# Patient Record
Sex: Female | Born: 1965
Health system: Southern US, Community
[De-identification: ages and names within clinical notes are randomized; demographics above are authoritative.]

## PROBLEM LIST (undated history)

## (undated) DIAGNOSIS — Z5189 Encounter for other specified aftercare: Secondary | ICD-10-CM

## (undated) DIAGNOSIS — D649 Anemia, unspecified: Secondary | ICD-10-CM

## (undated) HISTORY — DX: Encounter for other specified aftercare: Z51.89

## (undated) HISTORY — DX: Anemia, unspecified: D64.9

---

## 1992-04-18 DIAGNOSIS — Z5189 Encounter for other specified aftercare: Secondary | ICD-10-CM

## 1992-04-18 HISTORY — DX: Encounter for other specified aftercare: Z51.89

## 2000-06-30 ENCOUNTER — Other Ambulatory Visit: Admission: RE | Admit: 2000-06-30 | Discharge: 2000-06-30 | Payer: Self-pay | Admitting: Family Medicine

## 2001-03-07 ENCOUNTER — Other Ambulatory Visit: Admission: RE | Admit: 2001-03-07 | Discharge: 2001-03-07 | Payer: Self-pay | Admitting: Obstetrics and Gynecology

## 2001-05-14 ENCOUNTER — Other Ambulatory Visit: Admission: RE | Admit: 2001-05-14 | Discharge: 2001-05-14 | Payer: Self-pay | Admitting: Obstetrics and Gynecology

## 2002-03-06 ENCOUNTER — Ambulatory Visit (HOSPITAL_COMMUNITY): Admission: RE | Admit: 2002-03-06 | Discharge: 2002-03-06 | Payer: Self-pay | Admitting: Family Medicine

## 2002-03-06 ENCOUNTER — Encounter: Payer: Self-pay | Admitting: Family Medicine

## 2003-02-26 ENCOUNTER — Ambulatory Visit (HOSPITAL_COMMUNITY): Admission: RE | Admit: 2003-02-26 | Discharge: 2003-02-26 | Payer: Self-pay | Admitting: Family Medicine

## 2003-07-07 ENCOUNTER — Other Ambulatory Visit: Admission: RE | Admit: 2003-07-07 | Discharge: 2003-07-07 | Payer: Self-pay | Admitting: Family Medicine

## 2005-04-26 ENCOUNTER — Ambulatory Visit (HOSPITAL_COMMUNITY): Admission: RE | Admit: 2005-04-26 | Discharge: 2005-04-26 | Payer: Self-pay | Admitting: Family Medicine

## 2005-05-06 ENCOUNTER — Other Ambulatory Visit: Admission: RE | Admit: 2005-05-06 | Discharge: 2005-05-06 | Payer: Self-pay | Admitting: Family Medicine

## 2007-01-04 ENCOUNTER — Emergency Department (HOSPITAL_COMMUNITY): Admission: EM | Admit: 2007-01-04 | Discharge: 2007-01-04 | Payer: Self-pay | Admitting: Emergency Medicine

## 2009-06-01 ENCOUNTER — Emergency Department (HOSPITAL_COMMUNITY): Admission: EM | Admit: 2009-06-01 | Discharge: 2009-06-01 | Payer: Self-pay | Admitting: Family Medicine

## 2010-07-08 LAB — POCT RAPID STREP A (OFFICE): Streptococcus, Group A Screen (Direct): POSITIVE — AB

## 2010-10-07 ENCOUNTER — Inpatient Hospital Stay (INDEPENDENT_AMBULATORY_CARE_PROVIDER_SITE_OTHER)
Admission: RE | Admit: 2010-10-07 | Discharge: 2010-10-07 | Disposition: A | Discharge status: Home / Self Care | Payer: 59 | Source: Ambulatory Visit | Attending: Family Medicine | Admitting: Family Medicine

## 2010-10-07 DIAGNOSIS — IMO0002 Reserved for concepts with insufficient information to code with codable children: Secondary | ICD-10-CM

## 2011-03-31 ENCOUNTER — Other Ambulatory Visit: Payer: Self-pay | Admitting: Family Medicine

## 2011-03-31 ENCOUNTER — Other Ambulatory Visit (HOSPITAL_COMMUNITY)
Admission: RE | Admit: 2011-03-31 | Discharge: 2011-03-31 | Disposition: A | Discharge status: Home / Self Care | Payer: 59 | Source: Ambulatory Visit | Attending: Family Medicine | Admitting: Family Medicine

## 2011-03-31 DIAGNOSIS — Z01419 Encounter for gynecological examination (general) (routine) without abnormal findings: Secondary | ICD-10-CM | POA: Insufficient documentation

## 2012-06-19 ENCOUNTER — Other Ambulatory Visit: Payer: Self-pay | Admitting: Obstetrics and Gynecology

## 2012-06-19 ENCOUNTER — Other Ambulatory Visit (HOSPITAL_COMMUNITY)
Admission: RE | Admit: 2012-06-19 | Discharge: 2012-06-19 | Disposition: A | Discharge status: Home / Self Care | Payer: 59 | Source: Ambulatory Visit | Attending: Obstetrics and Gynecology | Admitting: Obstetrics and Gynecology

## 2012-06-19 DIAGNOSIS — Z1151 Encounter for screening for human papillomavirus (HPV): Secondary | ICD-10-CM | POA: Insufficient documentation

## 2012-06-19 DIAGNOSIS — Z01419 Encounter for gynecological examination (general) (routine) without abnormal findings: Secondary | ICD-10-CM | POA: Insufficient documentation

## 2012-09-15 ENCOUNTER — Emergency Department (INDEPENDENT_AMBULATORY_CARE_PROVIDER_SITE_OTHER)
Admission: EM | Admit: 2012-09-15 | Discharge: 2012-09-15 | Disposition: A | Discharge status: Home / Self Care | Payer: 59 | Source: Home / Self Care

## 2012-09-15 ENCOUNTER — Encounter (HOSPITAL_COMMUNITY): Payer: Self-pay | Admitting: Emergency Medicine

## 2012-09-15 DIAGNOSIS — J0100 Acute maxillary sinusitis, unspecified: Secondary | ICD-10-CM

## 2012-09-15 MED ORDER — FLUTICASONE PROPIONATE 50 MCG/ACT NA SUSP: 2.0000 | Freq: Every day | NASAL | Status: DC

## 2012-09-15 MED ORDER — METHYLPREDNISOLONE 4 MG PO KIT: PACK | ORAL | Status: DC

## 2012-09-15 MED ORDER — AMOXICILLIN-POT CLAVULANATE 875-125 MG PO TABS: 1.0000 | ORAL_TABLET | Freq: Two times a day (BID) | ORAL | Status: DC

## 2012-09-15 NOTE — ED Notes (Signed)
Reports left eye drainage and weird sensation of left jaw radiating up to left ear. Symptoms present since Sunday. Pt has not tried any otc meds for treatment. Denies any other symptoms.

## 2012-09-15 NOTE — ED Provider Notes (Signed)
History     CSN: [id number]  Arrival date & time 09/15/12  1353   First MD Initiated Contact with Patient 09/15/12 1443      Chief Complaint  Patient presents with  . Eye Drainage    left eye drainge since sunday. denies irritation. funny sensation of left jaw line.    (Consider location/radiation/quality/duration/timing/severity/associated sxs/prior treatment) HPI Comments: 47 year old female presents with a one-week history of left eye watering and sensation of puffiness below the left eye. She also has a sensation of swelling in the left face associated with mild discomfort. She denies problems with vision, speech, hearing or swallowing. Denies sore throat or fever. Denies pain around the eye.   History reviewed. No pertinent past medical history.  History reviewed. No pertinent past surgical history.  History reviewed. No pertinent family history.  History  Substance Use Topics  . Smoking status: Never Smoker   . Smokeless tobacco: Not on file  . Alcohol Use: No    OB History   Grav Para Term Preterm Abortions TAB SAB Ect Mult Living                  Review of Systems  Constitutional: Negative.   HENT: Positive for facial swelling. Negative for ear pain, sore throat and rhinorrhea.   Respiratory: Negative.   Gastrointestinal: Negative.   Genitourinary: Negative.   Skin: Negative.   Neurological: Positive for numbness. Negative for tremors, syncope, facial asymmetry, speech difficulty and headaches.       Sensation of mild numbness in the lower left face.    Allergies  Review of patient's allergies indicates not on file.  Home Medications   Current Outpatient Rx  Name  Route  Sig  Dispense  Refill  . amoxicillin-clavulanate (AUGMENTIN) 875-125 MG per tablet   Oral   Take 1 tablet by mouth every 12 (twelve) hours.   14 tablet   0   . fluticasone (FLONASE) 50 MCG/ACT nasal spray   Nasal   Place 2 sprays into the nose daily.   16 g   2   .  methylPREDNISolone (MEDROL DOSEPAK) 4 MG tablet      follow package directions   21 tablet   0     BP 108/87  Pulse 60  Temp(Src) 97.9 F (36.6 C) (Oral)  SpO2 100%  Physical Exam  Nursing note and vitals reviewed. Constitutional: She is oriented to person, place, and time. She appears well-nourished. No distress.  HENT:  Right Ear: External ear normal.  Left Ear: External ear normal.  Mouth/Throat: Oropharynx is clear and moist. No oropharyngeal exudate.  No dental tenderness. No gingival erythema or evidence of abscess. No erythema or swelling to the buccal mucosa. When pressure is applied to the left maxillary sinus there is moderate tenderness.  Eyes: Conjunctivae and EOM are normal. Pupils are equal, round, and reactive to light. Right eye exhibits no discharge. Left eye exhibits no discharge.  Neck: Normal range of motion.  Cardiovascular: Normal rate, regular rhythm and normal heart sounds.   Pulmonary/Chest: Effort normal and breath sounds normal. No respiratory distress. She has no wheezes.  Lymphadenopathy:    She has no cervical adenopathy.  Neurological: She is alert and oriented to person, place, and time.  Skin: Skin is warm and dry.  Psychiatric: She has a normal mood and affect.    ED Course  Procedures (including critical care time)  Labs Reviewed - No data to display No results found.   1.  Sinusitis, acute, maxillary       MDM  Medrol Dosepak as directed Fluticasone nasal spray as directed Augmentin 875 twice a day for 7 days Warm compresses to the face Sudafed PE 10 mg every 4-6 hours when necessary congestion Total of your primary care doctor next week as needed.         Hayden Rasmussen, NP 09/15/12 1526

## 2012-09-15 NOTE — ED Notes (Signed)
Pt states that she had noticed some swelling/puffiness of left eye also.

## 2012-09-16 NOTE — ED Provider Notes (Signed)
Medical screening examination/treatment/procedure(s) were performed by resident physician or non-physician practitioner and as supervising physician I was immediately available for consultation/collaboration.   Coriana Angello DOUGLAS MD.   Rael Yo D Alixander Rallis, MD 09/16/12 1841 

## 2014-07-24 ENCOUNTER — Encounter: Payer: Self-pay | Admitting: Internal Medicine

## 2014-09-26 ENCOUNTER — Telehealth: Payer: Self-pay | Admitting: *Deleted

## 2014-09-26 NOTE — Telephone Encounter (Signed)
Patient no show for colonoscopy appointment 09/26/14 at 230 pm. Attempted to call cell phone number, disconnected. Called work number and was informed patient out of the office until Tuesday. No messages left. No show letter mailed to patient and will give chart to Y. Homero Fellers to try and reschedule with patient.

## 2014-09-30 ENCOUNTER — Encounter: Payer: Self-pay | Admitting: Internal Medicine

## 2014-10-10 ENCOUNTER — Encounter: Payer: 59 | Admitting: Internal Medicine

## 2014-10-15 ENCOUNTER — Encounter: Payer: 59 | Admitting: Internal Medicine

## 2014-11-26 ENCOUNTER — Encounter: Payer: 59 | Admitting: Internal Medicine

## 2015-01-23 ENCOUNTER — Ambulatory Visit (AMBULATORY_SURGERY_CENTER): Payer: Self-pay | Admitting: *Deleted

## 2015-01-23 VITALS — Ht 66.0 in | Wt 228.0 lb

## 2015-01-23 DIAGNOSIS — Z1211 Encounter for screening for malignant neoplasm of colon: Secondary | ICD-10-CM

## 2015-01-23 NOTE — Progress Notes (Signed)
No egg or soy allergy No home 02 use No diet pills No issues with past sedation. Did have epidural headache after 1990 c section that had to be treated  emmi video to e mail

## 2015-02-13 ENCOUNTER — Encounter: Payer: 59 | Admitting: Gastroenterology

## 2015-03-17 ENCOUNTER — Encounter: Payer: Self-pay | Admitting: Gastroenterology

## 2015-03-17 ENCOUNTER — Ambulatory Visit (AMBULATORY_SURGERY_CENTER): Payer: 59 | Admitting: Gastroenterology

## 2015-03-17 VITALS — BP 104/51 | HR 71 | Temp 97.3°F | Resp 16 | Ht 66.0 in | Wt 228.0 lb

## 2015-03-17 DIAGNOSIS — Z1211 Encounter for screening for malignant neoplasm of colon: Secondary | ICD-10-CM | POA: Diagnosis not present

## 2015-03-17 MED ORDER — SODIUM CHLORIDE 0.9 % IV SOLN: 500.0000 mL | INTRAVENOUS | Status: DC

## 2015-03-17 NOTE — Progress Notes (Signed)
Stable to RR 

## 2015-03-17 NOTE — Patient Instructions (Signed)
YOU HAD AN ENDOSCOPIC PROCEDURE TODAY AT THE [facility]:   Refer to the procedure report that was given to you for any specific questions about what was found during the examination.  If the procedure report does not answer your questions, please call your gastroenterologist to clarify.  If you requested that your care partner not be given the details of your procedure findings, then the procedure report has been included in a sealed envelope for you to review at your convenience later.  YOU SHOULD EXPECT: Some feelings of bloating in the abdomen. Passage of more gas than usual.  Walking can help get rid of the air that was put into your GI tract during the procedure and reduce the bloating. If you had a lower endoscopy (such as a colonoscopy or flexible sigmoidoscopy) you may notice spotting of blood in your stool or on the toilet paper. If you underwent a bowel prep for your procedure, you may not have a normal bowel movement for a few days.  Please Note:  You might notice some irritation and congestion in your nose or some drainage.  This is from the oxygen used during your procedure.  There is no need for concern and it should clear up in a day or so.  SYMPTOMS TO REPORT IMMEDIATELY:   Following lower endoscopy (colonoscopy or flexible sigmoidoscopy):  Excessive amounts of blood in the stool  Significant tenderness or worsening of abdominal pains  Swelling of the abdomen that is new, acute  Fever of 100F or higher   For urgent or emergent issues, a gastroenterologist can be reached at any hour by calling [phone].   DIET: Your first meal following the procedure should be a small meal and then it is ok to progress to your normal diet. Heavy or fried foods are harder to digest and may make you feel nauseous or bloated.  Likewise, meals heavy in dairy and vegetables can increase bloating.  Drink plenty of fluids but you should avoid alcoholic beverages for 24  hours.  ACTIVITY:  You should plan to take it easy for the rest of today and you should NOT DRIVE or use heavy machinery until tomorrow (because of the sedation medicines used during the test).    FOLLOW UP: Our staff will call the number listed on your records the next business day following your procedure to check on you and address any questions or concerns that you may have regarding the information given to you following your procedure. If we do not reach you, we will leave a message.  However, if you are feeling well and you are not experiencing any problems, there is no need to return our call.  We will assume that you have returned to your regular daily activities without incident.  If any biopsies were taken you will be contacted by phone or by letter within the next 1-3 weeks.  Please call us at [phone] if you have not heard about the biopsies in 3 weeks.    SIGNATURES/CONFIDENTIALITY: You and/or your care partner have signed paperwork which will be entered into your electronic medical record.  These signatures attest to the fact that that the information above on your After Visit Summary has been reviewed and is understood.  Full responsibility of the confidentiality of this discharge information lies with you and/or your care-partner. 

## 2015-03-17 NOTE — Op Note (Signed)
[facility] [address] [place], [id number]   COLONOSCOPY PROCEDURE REPORT  PATIENT: Jenny Carroll, Jenny B  MR#: [id number] BIRTHDATE: 1965/12/04 , 49  yrs. old GENDER: female ENDOSCOPIST: Marsa ArisKavitha Nandigam, MD REFERRED ON:[id number]:Cynthia Cliffton AstersWhite, M.D. PROCEDURE DATE:  03/17/2015 PROCEDURE:   Colonoscopy, screening First Screening Colonoscopy - Avg.  risk and is 50 yrs.  old or older Yes.  Prior Negative Screening - Now for repeat screening. N/A  History of Adenoma - Now for follow-up colonoscopy & has been > or = to 3 yrs.  N/A  Polyps removed today? No Recommend repeat exam, <10 yrs? No ASA CLASS:   Class I INDICATIONS:Screening for colonic neoplasia and Colorectal Neoplasm Risk Assessment for this procedure is average risk. MEDICATIONS: Propofol 250 mg IV and Lidocaine 40 mg  DESCRIPTION OF PROCEDURE:   After the risks benefits and alternatives of the procedure were thoroughly explained, informed consent was obtained.  The digital rectal exam revealed no abnormalities of the rectum.   The LB PFC-H190 [id number]  endoscope was introduced through the anus and advanced to the cecum, which was identified by both the appendix and ileocecal valve. No adverse events experienced.   The quality of the prep was good.  The instrument was then slowly withdrawn as the colon was fully examined. Estimated blood loss is zero unless otherwise noted in this procedure report.   COLON FINDINGS: A normal appearing cecum, ileocecal valve, and appendiceal orifice were identified.  The ascending, transverse, descending, sigmoid colon, and rectum appeared unremarkable. Retroflexed views revealed internal Grade I hemorrhoids. The time to cecum = 5.18 Withdrawal time = 7.50        The scope was withdrawn and the procedure completed. COMPLICATIONS: There were no immediate complications.  ENDOSCOPIC IMPRESSION: Normal colonoscopy  RECOMMENDATIONS: 1.  You should continue to follow colorectal cancer  screening guidelines for "routine risk" patients with a repeat colonoscopy in 10 years.  eSigned:  Marsa ArisKavitha Nandigam, MD 03/17/2015 11:37 AM

## 2015-03-18 ENCOUNTER — Telehealth: Payer: Self-pay | Admitting: *Deleted

## 2015-03-18 NOTE — Telephone Encounter (Signed)
  Follow up Call-  Call back number 03/17/2015  Post procedure Call Back phone  # [phone]  Permission to leave phone message Yes     Patient questions:  Do you have a fever, pain , or abdominal swelling? No. Pain Score  0 *  Have you tolerated food without any problems? Yes.    Have you been able to return to your normal activities? Yes.    Do you have any questions about your discharge instructions: Diet   No. Medications  No. Follow up visit  No.  Do you have questions or concerns about your Care? No.  Actions: * If pain score is 4 or above: No action needed, pain <4.

## 2015-04-14 ENCOUNTER — Ambulatory Visit (INDEPENDENT_AMBULATORY_CARE_PROVIDER_SITE_OTHER): Payer: 59

## 2015-04-14 ENCOUNTER — Ambulatory Visit (INDEPENDENT_AMBULATORY_CARE_PROVIDER_SITE_OTHER): Payer: 59 | Admitting: Family Medicine

## 2015-04-14 VITALS — BP 138/86 | HR 74 | Temp 97.9°F | Resp 16 | Ht 66.25 in | Wt 228.6 lb

## 2015-04-14 DIAGNOSIS — M792 Neuralgia and neuritis, unspecified: Secondary | ICD-10-CM | POA: Diagnosis not present
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
## 2015-04-14 NOTE — Patient Instructions (Signed)
It appears that you have a "stinger" or "burner"- this is when the brachial plexus nerves are stretched from an injury I expect that your symptoms will go away totally- however if you still have any discomfort in a week or so let me know You may certainly use ibuprofen as needed over the next few days- you can take up to 800 mg three times a day if needed  Contact me right away if any worsening of your symptoms.

## 2015-04-22 ENCOUNTER — Ambulatory Visit: Payer: 59

## 2015-05-04 ENCOUNTER — Ambulatory Visit (INDEPENDENT_AMBULATORY_CARE_PROVIDER_SITE_OTHER): Payer: 59 | Admitting: Family Medicine

## 2015-05-04 VITALS — BP 116/60 | HR 88 | Temp 98.3°F | Resp 16 | Ht 66.5 in | Wt 230.0 lb

## 2015-05-04 DIAGNOSIS — M792 Neuralgia and neuritis, unspecified: Secondary | ICD-10-CM | POA: Diagnosis not present

## 2015-05-04 DIAGNOSIS — M79602 Pain in left arm: Secondary | ICD-10-CM

## 2015-05-04 DIAGNOSIS — T148XXA Other injury of unspecified body region, initial encounter: Secondary | ICD-10-CM
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
## 2015-05-04 NOTE — Patient Instructions (Signed)
The left arm appears to be completely healed. There is no swelling, have good range of motion, there is no black and blue markings in the bones themselves have normal features. I think it's find to go ahead and settle with the insurance company at this point.

## 2015-05-18 MED FILL — IBUPROFEN 800 MG TABLET: 800 | 5 days supply | Qty: 20 | Fill #0

## 2015-05-18 MED FILL — AMOXICILLIN 500 MG CAPSULE: 500 | 7 days supply | Qty: 21 | Fill #0

## 2015-05-19 MED FILL — HYDROCODON-APAP 5-325: 5-325 | 5 days supply | Qty: 16 | Fill #0

## 2015-06-29 DIAGNOSIS — J101 Influenza due to other identified influenza virus with other respiratory manifestations: Secondary | ICD-10-CM | POA: Diagnosis not present

## 2015-06-29 DIAGNOSIS — R6889 Other general symptoms and signs: Secondary | ICD-10-CM | POA: Diagnosis not present

## 2015-09-28 DIAGNOSIS — E6609 Other obesity due to excess calories: Secondary | ICD-10-CM | POA: Diagnosis not present

## 2015-09-28 DIAGNOSIS — D509 Iron deficiency anemia, unspecified: Secondary | ICD-10-CM | POA: Diagnosis not present

## 2015-09-28 DIAGNOSIS — E559 Vitamin D deficiency, unspecified: Secondary | ICD-10-CM | POA: Diagnosis not present

## 2015-09-28 DIAGNOSIS — M25561 Pain in right knee: Secondary | ICD-10-CM | POA: Diagnosis not present

## 2015-09-28 DIAGNOSIS — Z13220 Encounter for screening for lipoid disorders: Secondary | ICD-10-CM | POA: Diagnosis not present

## 2015-09-28 DIAGNOSIS — Z0000 Encounter for general adult medical examination without abnormal findings: Secondary | ICD-10-CM | POA: Diagnosis not present

## 2015-10-05 DIAGNOSIS — Z0000 Encounter for general adult medical examination without abnormal findings: Secondary | ICD-10-CM | POA: Diagnosis not present

## 2015-10-05 DIAGNOSIS — Z13220 Encounter for screening for lipoid disorders: Secondary | ICD-10-CM | POA: Diagnosis not present

## 2015-10-05 DIAGNOSIS — E559 Vitamin D deficiency, unspecified: Secondary | ICD-10-CM | POA: Diagnosis not present

## 2015-10-05 DIAGNOSIS — D509 Iron deficiency anemia, unspecified: Secondary | ICD-10-CM | POA: Diagnosis not present

## 2015-11-17 ENCOUNTER — Encounter: Payer: Self-pay | Admitting: Skilled Nursing Facility1

## 2015-11-17 ENCOUNTER — Encounter: Payer: 59 | Attending: Family Medicine | Admitting: Skilled Nursing Facility1

## 2015-11-17 DIAGNOSIS — Z713 Dietary counseling and surveillance: Secondary | ICD-10-CM | POA: Insufficient documentation

## 2015-11-17 DIAGNOSIS — E669 Obesity, unspecified: Secondary | ICD-10-CM

## 2015-11-17 NOTE — Progress Notes (Signed)
  Medical Nutrition Therapy:  Appt start time: 1500 end time:  1600.   Assessment:  Primary concerns today: self referral. Pt states she is in the appointment due to the live life well plan at [place].  Pt states she has never tried to lose weight before. Pt states she goes to sleep about 11:00pm and wakes 6am. Pt states she is a picky eater. Pt states she is currently at her usual wt. Pt states she has been eating out more often. Pt states it is difficult to cook only for herself.    Preferred Learning Style:   No preference indicated   Learning Readiness:   Contemplating  MEDICATIONS: See List   DIETARY INTAKE:  Usual eating pattern includes 3 meals and 1 snacks per day.  Everyday foods include fast food.  Avoided foods include none identified.    24-hr recall:  B ( AM): none----sometimes cereal mostly nothing Snk ( AM):  fruit L ( PM): none Snk ( PM): crackers D ( PM): spagetti-----baked chicken and rice corn---salad---fried chicken----greens cabbage Snk ( PM): cookies or chips Beverages: sweet tea, soda, water  *2 meals a week outside the home  Usual physical activity: ADL's  Progress Towards Goal(s):  In progress.     Intervention:  Nutrition counseling for obesity. Dietitian educated the pt on balanced meals, fast food, and the impotence of physical activity.  Goals: -Honor your body by listening to your hunger and fullness cues -Possible breakfast: fruit, 2 pieces of bacon, slice of whole wheat OR cereal with 2% milk with chopped banana and a small handful of nuts -Possible lunches: cabbage, baked italian chicken, rice OR grilled chicken, potatoes, and salad OR Peanut butter jelly sandwich and cherry tomatoes -Possible dinners: Leftovers from lunch OR Baked flounder with potato and asparagus  -A meal: carbohydrate, vegetables, and protein -Try to avoid fried foods -A snack: Fruit OR Vegetable AND protein -Try to walk 3 days a week for 20 minutes: when it rains  do a youtube video -Try Zumba again -Eat at least 3 meals a day  Teaching Method Utilized:  Visual Auditory Hands on  Demonstrated degree of understanding via:  Teach Back   Monitoring/Evaluation:  Dietary intake, exercise, and body weight prn.

## 2015-11-17 NOTE — Patient Instructions (Addendum)
-  Honor your body by listening to your hunger and fullness cues -Possible breakfast: fruit, 2 pieces of bacon, slice of whole wheat OR cereal with 2% milk with chopped banana and a small handful of nuts -Possible lunches: cabbage, baked italian chicken, rice OR grilled chicken, potatoes, and salad OR Peanut butter jelly sandwich and cherry tomatoes -Possible dinners: Leftovers from lunch OR Baked flounder with potato and asparagus  -A meal: carbohydrate, vegetables, and protein -Try to avoid fried foods -A snack: Fruit OR Vegetable AND protein -Try to walk 3 days a week for 20 minutes: when it rains do a youtube video -Try Zumba again -Eat at least 3 meals a day

## 2016-04-05 DIAGNOSIS — Z1231 Encounter for screening mammogram for malignant neoplasm of breast: Secondary | ICD-10-CM | POA: Diagnosis not present

## 2016-08-22 DIAGNOSIS — H52222 Regular astigmatism, left eye: Secondary | ICD-10-CM | POA: Diagnosis not present

## 2016-08-22 DIAGNOSIS — H524 Presbyopia: Secondary | ICD-10-CM | POA: Diagnosis not present

## 2016-08-22 DIAGNOSIS — H5203 Hypermetropia, bilateral: Secondary | ICD-10-CM | POA: Diagnosis not present

## 2016-08-22 DIAGNOSIS — H18413 Arcus senilis, bilateral: Secondary | ICD-10-CM | POA: Diagnosis not present

## 2016-11-21 ENCOUNTER — Other Ambulatory Visit (HOSPITAL_COMMUNITY)
Admission: RE | Admit: 2016-11-21 | Discharge: 2016-11-21 | Disposition: A | Discharge status: Home / Self Care | Payer: 59 | Source: Ambulatory Visit | Attending: Family Medicine | Admitting: Family Medicine

## 2016-11-21 ENCOUNTER — Other Ambulatory Visit: Payer: Self-pay | Admitting: Family Medicine

## 2016-11-21 DIAGNOSIS — E559 Vitamin D deficiency, unspecified: Secondary | ICD-10-CM | POA: Diagnosis not present

## 2016-11-21 DIAGNOSIS — B372 Candidiasis of skin and nail: Secondary | ICD-10-CM | POA: Diagnosis not present

## 2016-11-21 DIAGNOSIS — Z124 Encounter for screening for malignant neoplasm of cervix: Secondary | ICD-10-CM | POA: Diagnosis not present

## 2016-11-21 DIAGNOSIS — D509 Iron deficiency anemia, unspecified: Secondary | ICD-10-CM | POA: Diagnosis not present

## 2016-11-21 DIAGNOSIS — N6489 Other specified disorders of breast: Secondary | ICD-10-CM | POA: Diagnosis not present

## 2016-11-21 DIAGNOSIS — Z6837 Body mass index (BMI) 37.0-37.9, adult: Secondary | ICD-10-CM | POA: Diagnosis not present

## 2016-11-21 DIAGNOSIS — Z0000 Encounter for general adult medical examination without abnormal findings: Secondary | ICD-10-CM | POA: Diagnosis not present

## 2016-11-21 DIAGNOSIS — E049 Nontoxic goiter, unspecified: Secondary | ICD-10-CM | POA: Diagnosis not present

## 2016-11-21 DIAGNOSIS — Z13220 Encounter for screening for lipoid disorders: Secondary | ICD-10-CM | POA: Diagnosis not present

## 2016-11-22 LAB — CYTOLOGY - PAP
ADEQUACY: ABSENT
DIAGNOSIS: NEGATIVE
HPV: NOT DETECTED

## 2016-11-29 ENCOUNTER — Other Ambulatory Visit: Payer: Self-pay | Admitting: Family Medicine

## 2016-11-29 DIAGNOSIS — E049 Nontoxic goiter, unspecified: Secondary | ICD-10-CM

## 2016-12-13 ENCOUNTER — Ambulatory Visit
Admission: RE | Admit: 2016-12-13 | Discharge: 2016-12-13 | Disposition: A | Discharge status: Home / Self Care | Payer: 59 | Source: Ambulatory Visit | Attending: Family Medicine | Admitting: Family Medicine

## 2016-12-13 DIAGNOSIS — E049 Nontoxic goiter, unspecified: Secondary | ICD-10-CM

## 2016-12-13 DIAGNOSIS — E041 Nontoxic single thyroid nodule: Secondary | ICD-10-CM | POA: Diagnosis not present

## 2017-01-12 ENCOUNTER — Encounter: Payer: Self-pay | Admitting: Dietician

## 2017-01-12 ENCOUNTER — Encounter: Payer: 59 | Attending: Family Medicine | Admitting: Dietician

## 2017-01-12 DIAGNOSIS — Z713 Dietary counseling and surveillance: Secondary | ICD-10-CM | POA: Insufficient documentation

## 2017-01-12 NOTE — Patient Instructions (Addendum)
Great job on adding breakfast! Aim for an active lifestyle. Listen to your body.  When are you full?  When is it time for bed? Consider rethinking your beverages.  Consider the Calorie [employer].

## 2017-01-12 NOTE — Progress Notes (Signed)
  Medical Nutrition Therapy:  Appt start time: 1445 end time:  1500.   Assessment:  Primary concerns today: Patient is here today alone.  She is here due to the live life well program at [facility].  Weight 234 lbs and overall stable.  She has not health concerns.  She reports losing a little weight in the past but then gaining it back.  She sleeps only about 6 hours per night and states that she stays up even when she is tired.     Patient lives with her daughter currently and they share shopping and cooking.  Her daughter has a Systems analyst and tries to eat healthfully.  She will be living alone in about a month.  She is currently eating out more often.  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  Preferred Learning Style:   No preference indicated   Learning Readiness:   Contemplating   MEDICATIONS: vitamin D 1000 units   DIETARY INTAKE:  Usual eating pattern includes 2-3 meals and 1-2 snacks per day.  24-hr recall:  B ( AM): 2 eggs, fruit  Snk ( AM): occasional NABS  L ( PM): salad with protein OR cucumbers and fruit Snk ( PM): none D ( PM): baked chicken, cabbage, rice Snk ( PM): occasional chips or ice cream  Beverages: regular soda (1), water  Usual physical activity: Enjoys Zumba and walking but does not do this consistently.  Progress Towards Goal(s):  In progress.   Nutritional Diagnosis:  NB-1.1 Food and nutrition-related knowledge deficit As related to balanced eating.  As evidenced by diet hx and patient report.    Intervention:  Nutrition counseling/education related to balanced meals, eating out, and the importance of adequate sleep and physical activity.  Great job on adding breakfast! Aim for an active lifestyle. Listen to your body.  When are you full?  When is it time for bed? Consider rethinking your beverages.  Consider the Calorie [employer].  Teaching Method Utilized:  Auditory  Barriers to learning/adherence to lifestyle change:  motivation  Demonstrated degree of understanding via:  Teach Back   Monitoring/Evaluation:  Dietary intake, exercise, and body weight prn.

## 2017-02-02 DIAGNOSIS — N62 Hypertrophy of breast: Secondary | ICD-10-CM | POA: Diagnosis not present

## 2017-04-05 DIAGNOSIS — Z1231 Encounter for screening mammogram for malignant neoplasm of breast: Secondary | ICD-10-CM | POA: Diagnosis not present

## 2017-07-26 ENCOUNTER — Encounter: Payer: Self-pay | Admitting: Nurse Practitioner

## 2017-07-26 ENCOUNTER — Ambulatory Visit (INDEPENDENT_AMBULATORY_CARE_PROVIDER_SITE_OTHER): Payer: Self-pay | Admitting: Nurse Practitioner

## 2017-07-26 VITALS — BP 120/84 | HR 97 | Temp 98.9°F | Wt 236.6 lb

## 2017-07-26 DIAGNOSIS — J101 Influenza due to other identified influenza virus with other respiratory manifestations: Secondary | ICD-10-CM

## 2017-07-26 DIAGNOSIS — R6889 Other general symptoms and signs: Secondary | ICD-10-CM

## 2017-07-26 LAB — POCT INFLUENZA A/B
Influenza A, POC: POSITIVE — AB
Influenza B, POC: NEGATIVE

## 2017-07-26 MED ORDER — BENZONATATE 100 MG PO CAPS: 100.0000 mg | ORAL_CAPSULE | Freq: Three times a day (TID) | ORAL | 0 refills | Status: AC | PRN

## 2017-07-26 MED ORDER — OSELTAMIVIR PHOSPHATE 75 MG PO CAPS: 75.0000 mg | ORAL_CAPSULE | Freq: Two times a day (BID) | ORAL | 0 refills | Status: AC

## 2017-07-26 MED ORDER — ALBUTEROL SULFATE HFA 108 (90 BASE) MCG/ACT IN AERS: 2.0000 | INHALATION_SPRAY | Freq: Four times a day (QID) | RESPIRATORY_TRACT | 0 refills | Status: DC | PRN

## 2017-07-26 MED FILL — VENTOLIN HFA 90 MCG INHALER: 108 (90 BAS | 25 days supply | Qty: 18 | Fill #0

## 2017-07-26 MED FILL — BENZONATATE 100 MG CAPS: 100 | 10 days supply | Qty: 30 | Fill #0

## 2017-07-26 MED FILL — OSELTAMIVIR PHOSPHATE 75 MG: 75 | 5 days supply | Qty: 10 | Fill #0

## 2017-07-26 NOTE — Progress Notes (Addendum)
   Subjective:    Patient ID: Jenny Carroll, female    DOB: 03/31/1966, 52 y.o.   MRN: [id number]  The patient is a 52 year old female who presents today with complaints of chills and cough times 1 day.  The patient states she never checked her fever but did have moments where she felt hot and cold.  Patient states cough worsened over last night for which she was taking cough medicine with no improvement.  Patient has not taken any medications to control her fever.  Patient does complain of headache, diarrhea, weakness, and body aches.  Patient denies nausea vomiting or abdominal pain.  Patient's past medical history, allergies, and medications were reviewed.  Review of Systems  Constitutional: Positive for activity change, appetite change, chills, fatigue and fever.  HENT: Positive for congestion, rhinorrhea and sinus pressure. Negative for ear discharge, ear pain, postnasal drip, sneezing and sore throat.   Eyes: Negative.   Respiratory: Positive for cough and wheezing.   Cardiovascular: Negative.   Gastrointestinal: Positive for diarrhea. Negative for abdominal pain, constipation, nausea and vomiting.  Allergic/Immunologic: Negative for environmental allergies.  Neurological: Positive for headaches. Negative for tremors, seizures, syncope, weakness and light-headedness.  Psychiatric/Behavioral: Negative.        Objective:   Physical Exam  Constitutional: She is oriented to person, place, and time. She appears well-developed and well-nourished.  Ill-appearing.  HENT:  Head: Normocephalic and atraumatic.  Right Ear: External ear normal.  Left Ear: External ear normal.  Mouth/Throat: Oropharynx is clear and moist. No oropharyngeal exudate.  Eyes: Pupils are equal, round, and reactive to light. Conjunctivae and EOM are normal.  Neck: Normal range of motion. Neck supple. No tracheal deviation present. No thyromegaly present.  Cardiovascular: Normal rate, regular rhythm and normal heart  sounds.  Pulmonary/Chest: Effort normal and breath sounds normal.  Rhonchi to left posterior upper lobe.  Abdominal: Soft. Bowel sounds are normal. She exhibits no distension. There is no tenderness. There is no rebound.  Neurological: She is alert and oriented to person, place, and time.  Skin: Skin is warm and dry.  Psychiatric: She has a normal mood and affect.  Vitals reviewed.      Assessment & Plan:  Influenza A 1 Tamiflu 75 mg twice daily for 5 days. 2. Ibuprofen or Tylenol for pain fever and general discomfort. 3.  Rest.  Fluids. 4.Albuterol for wheezing or shortness of breath and Tessalon Perles for cough as needed. 5.  Discussed with patient that she is contagious while she is febrile.  Patient instructed to remain out of work until she has been fever free for 24 hours. 6.  Patient verbalizes understanding and has no questions at time of discharge.

## 2017-07-26 NOTE — Patient Instructions (Signed)

## 2017-07-26 NOTE — Progress Notes (Signed)
error 

## 2017-08-09 ENCOUNTER — Telehealth: Payer: Self-pay

## 2017-08-29 ENCOUNTER — Encounter: Payer: Self-pay | Admitting: Family

## 2017-08-29 ENCOUNTER — Ambulatory Visit (INDEPENDENT_AMBULATORY_CARE_PROVIDER_SITE_OTHER): Payer: Self-pay | Admitting: Family

## 2017-08-29 VITALS — BP 120/78 | HR 75 | Temp 98.4°F | Resp 20 | Wt 233.0 lb

## 2017-08-29 DIAGNOSIS — Z00129 Encounter for routine child health examination without abnormal findings: Secondary | ICD-10-CM

## 2017-08-29 DIAGNOSIS — Z0000 Encounter for general adult medical examination without abnormal findings: Secondary | ICD-10-CM

## 2017-08-29 LAB — POCT URINALYSIS DIPSTICK
Bilirubin, UA: NEGATIVE
Blood, UA: NEGATIVE
Glucose, UA: NEGATIVE
KETONES UA: NEGATIVE
Leukocytes, UA: NEGATIVE
NITRITE UA: NEGATIVE
Protein, UA: NEGATIVE
SPEC GRAV UA: 1.015 (ref 1.010–1.025)
UROBILINOGEN UA: 0.2 U/dL
pH, UA: 6 (ref 5.0–8.0)

## 2017-08-29 NOTE — Progress Notes (Signed)
Subjective:     Patient ID: Jenny Carroll, female   DOB: 07/09/1965, 52 y.o.   MRN: [id number]  HPI 52 year old AAF, nonsmoker is in today for a CPX, denies any concerns. Reports she started exercising 1 week ago, walking during her lunch break for about 20 mins. She wishes to reduce her weight and plans to incorporate more exercise. She reports having a mammogram Nov 2018, Pap smear is UTD, Colonoscopy was normal in 2016 and does not have a family history of colon CA. She will have fasting labs done through her PCP  Review of Systems  Constitutional: Negative.   HENT: Negative.   Eyes: Negative.   Cardiovascular: Negative.   Gastrointestinal: Negative.   Endocrine: Negative.   Genitourinary: Negative.   Musculoskeletal: Negative.   Skin: Negative.   Allergic/Immunologic: Negative.   Hematological: Negative.   Psychiatric/Behavioral: Negative.    Past Medical History:  Diagnosis Date  . Anemia    history of anemai, better since no periods anymore  . Blood transfusion without reported diagnosis 1994    Social History   Socioeconomic History  . Marital status: Married    Spouse name: Not on file  . Number of children: Not on file  . Years of education: Not on file  . Highest education level: Not on file  Occupational History  . Not on file  Social Needs  . Financial resource strain: Not on file  . Food insecurity:    Worry: Not on file    Inability: Not on file  . Transportation needs:    Medical: Not on file    Non-medical: Not on file  Tobacco Use  . Smoking status: Never Smoker  . Smokeless tobacco: Never Used  Substance and Sexual Activity  . Alcohol use: No    Alcohol/week: 0.0 oz  . Drug use: No  . Sexual activity: Yes  Lifestyle  . Physical activity:    Days per week: Not on file    Minutes per session: Not on file  . Stress: Not on file  Relationships  . Social connections:    Talks on phone: Not on file    Gets together: Not on file    Attends  religious service: Not on file    Active member of club or organization: Not on file    Attends meetings of clubs or organizations: Not on file    Relationship status: Not on file  . Intimate partner violence:    Fear of current or ex partner: Not on file    Emotionally abused: Not on file    Physically abused: Not on file    Forced sexual activity: Not on file  Other Topics Concern  . Not on file  Social History Narrative  . Not on file    Past Surgical History:  Procedure Laterality Date  . CESAREAN SECTION     x3    Family History  Problem Relation Age of Onset  . Heart disease Maternal Uncle   . Diabetes Maternal Grandmother   . Colon cancer Neg Hx   . Rectal cancer Neg Hx   . Stomach cancer Neg Hx   . Esophageal cancer Neg Hx     No Known Allergies  Current Outpatient Medications on File Prior to Visit  Medication Sig Dispense Refill  . albuterol (PROVENTIL HFA) 108 (90 Base) MCG/ACT inhaler Inhale 2 puffs into the lungs every 6 (six) hours as needed for up to 10 days for wheezing or  shortness of breath. 1 Inhaler 0  . cholecalciferol (VITAMIN D) 1000 UNITS tablet Take 1,000 Units by mouth daily.     No current facility-administered medications on file prior to visit.     BP 120/78 (BP Location: Right Arm, Patient Position: Sitting, Cuff Size: Normal)   Pulse 75   Temp 98.4 F (36.9 C) (Oral)   Resp 20   Wt 233 lb (105.7 kg)   SpO2 96%   BMI 37.61 kg/m chart    Objective:   Physical Exam  Constitutional: She is oriented to person, place, and time. She appears well-developed.  HENT:  Head: Normocephalic and atraumatic.  Eyes: Pupils are equal, round, and reactive to light. Conjunctivae and EOM are normal.  Neck: Normal range of motion. Neck supple. No thyromegaly present.  Cardiovascular: Normal rate, regular rhythm and intact distal pulses.  No murmur heard. Pulmonary/Chest: Effort normal and breath sounds normal. No respiratory distress.  Abdominal:  Soft. Bowel sounds are normal. She exhibits no distension. There is no tenderness.  Genitourinary:  Genitourinary Comments: Deferred to GYN  Musculoskeletal: Normal range of motion.  Neurological: She is alert and oriented to person, place, and time.  Skin: Skin is warm and dry.  Psychiatric: She has a normal mood and affect.       Assessment:     Jenny Carroll was seen today for cpe.  Diagnoses and all orders for this visit:  Healthy adult on routine physical examination -     POCT urinalysis dipstick      Plan:     See PCP for routine labs. Anticipatory Guidance appropriate for age discussed. Encouraged VItamin D and Calcium for bone health.

## 2017-08-29 NOTE — Patient Instructions (Addendum)
1. See PCP for routine labs ASAP 2. Colonoscopy Due 2026 3. Be sure you have had a Mammogram and Bone Density Scan 4. Daily multi-vitamin with calcium and vitamin D 5. Exercise 180 min a week  Heart-Healthy Eating Plan Heart-healthy meal planning includes:  Limiting unhealthy fats.  Increasing healthy fats.  Making other small dietary changes.  You may need to talk with your doctor or a diet specialist (dietitian) to create an eating plan that is right for you. What types of fat should I choose?  Choose healthy fats. These include olive oil and canola oil, flaxseeds, walnuts, almonds, and seeds.  Eat more omega-3 fats. These include salmon, mackerel, sardines, tuna, flaxseed oil, and ground flaxseeds. Try to eat fish at least twice each week.  Limit saturated fats. ? Saturated fats are often found in animal products, such as meats, butter, and cream. ? Plant sources of saturated fats include palm oil, palm kernel oil, and coconut oil.  Avoid foods with partially hydrogenated oils in them. These include stick margarine, some tub margarines, cookies, crackers, and other baked goods. These contain trans fats. What general guidelines do I need to follow?  Check food labels carefully. Identify foods with trans fats or high amounts of saturated fat.  Fill one half of your plate with vegetables and green salads. Eat 4-5 servings of vegetables per day. A serving of vegetables is: ? 1 cup of raw leafy vegetables. ?  cup of raw or cooked cut-up vegetables. ?  cup of vegetable juice.  Fill one fourth of your plate with whole grains. Look for the word "whole" as the first word in the ingredient list.  Fill one fourth of your plate with lean protein foods.  Eat 4-5 servings of fruit per day. A serving of fruit is: ? One medium whole fruit. ?  cup of dried fruit. ?  cup of fresh, frozen, or canned fruit. ?  cup of 100% fruit juice.  Eat more foods that contain soluble fiber.  These include apples, broccoli, carrots, beans, peas, and barley. Try to get 20-30 g of fiber per day.  Eat more home-cooked food. Eat less restaurant, buffet, and fast food.  Limit or avoid alcohol.  Limit foods high in starch and sugar.  Avoid fried foods.  Avoid frying your food. Try baking, boiling, grilling, or broiling it instead. You can also reduce fat by: ? Removing the skin from poultry. ? Removing all visible fats from meats. ? Skimming the fat off of stews, soups, and gravies before serving them. ? Steaming vegetables in water or broth.  Lose weight if you are overweight.  Eat 4-5 servings of nuts, legumes, and seeds per week: ? One serving of dried beans or legumes equals  cup after being cooked. ? One serving of nuts equals 1 ounces. ? One serving of seeds equals  ounce or one tablespoon.  You may need to keep track of how much salt or sodium you eat. This is especially true if you have high blood pressure. Talk with your doctor or dietitian to get more information. What foods can I eat? Grains Breads, including [place], white, pita, wheat, raisin, rye, oatmeal, and [place]. Tortillas that are neither fried nor made with lard or trans fat. Low-fat rolls, including hotdog and hamburger buns and English muffins. Biscuits. Muffins. Waffles. Pancakes. Light popcorn. Whole-grain cereals. Flatbread. Melba toast. Pretzels. Breadsticks. Rusks. Low-fat snacks. Low-fat crackers, including oyster, saltine, matzo, graham, animal, and rye. Rice and pasta, including brown rice  and pastas that are made with whole wheat. Vegetables All vegetables. Fruits All fruits, but limit coconut. Meats and Other Protein Sources Lean, well-trimmed beef, veal, pork, and lamb. Chicken and [place] without skin. All fish and shellfish. Wild duck, rabbit, pheasant, and venison. Egg whites or low-cholesterol egg substitutes. Dried beans, peas, lentils, and tofu. Seeds and most nuts. Dairy Low-fat or  nonfat cheeses, including ricotta, string, and mozzarella. Skim or 1% milk that is liquid, powdered, or evaporated. Buttermilk that is made with low-fat milk. Nonfat or low-fat yogurt. Beverages Mineral water. Diet carbonated beverages. Sweets and Desserts Sherbets and fruit ices. Honey, jam, marmalade, jelly, and syrups. Meringues and gelatins. Pure sugar candy, such as hard candy, jelly beans, gumdrops, mints, marshmallows, and small amounts of dark chocolate. [employer]. Eat all sweets and desserts in moderation. Fats and Oils Nonhydrogenated (trans-free) margarines. Vegetable oils, including soybean, sesame, sunflower, olive, peanut, safflower, corn, canola, and cottonseed. Salad dressings or mayonnaise made with a vegetable oil. Limit added fats and oils that you use for cooking, baking, salads, and as spreads. Other Cocoa powder. Coffee and tea. All seasonings and condiments. The items listed above may not be a complete list of recommended foods or beverages. Contact your dietitian for more options. What foods are not recommended? Grains Breads that are made with saturated or trans fats, oils, or whole milk. Croissants. Butter rolls. Cheese breads. Sweet rolls. Donuts. Buttered popcorn. Chow mein noodles. High-fat crackers, such as cheese or butter crackers. Meats and Other Protein Sources Fatty meats, such as hotdogs, short ribs, sausage, spareribs, bacon, rib eye roast or steak, and mutton. High-fat deli meats, such as salami and bologna. Caviar. Domestic duck and goose. Organ meats, such as kidney, liver, sweetbreads, and heart. Dairy Cream, sour cream, cream cheese, and creamed cottage cheese. Whole-milk cheeses, including blue (bleu), [address], [place], [place], [address], [place], [address], cheddar, [place], and [place]. Whole or 2% milk that is liquid, evaporated, or condensed. Whole buttermilk. Cream sauce or high-fat cheese sauce. Yogurt that is made from whole  milk. Beverages Regular sodas and juice drinks with added sugar. Sweets and Desserts Frosting. Pudding. Cookies. Cakes other than angel food cake. Candy that has milk chocolate or white chocolate, hydrogenated fat, butter, coconut, or unknown ingredients. Buttered syrups. Full-fat ice cream or ice cream drinks. Fats and Oils Gravy that has suet, meat fat, or shortening. Cocoa butter, hydrogenated oils, palm oil, coconut oil, palm kernel oil. These can often be found in baked products, candy, fried foods, nondairy creamers, and whipped toppings. Solid fats and shortenings, including bacon fat, salt pork, lard, and butter. Nondairy cream substitutes, such as coffee creamers and sour cream substitutes. Salad dressings that are made of unknown oils, cheese, or sour cream. The items listed above may not be a complete list of foods and beverages to avoid. Contact your dietitian for more information. This information is not intended to replace advice given to you by your health care provider. Make sure you discuss any questions you have with your health care provider. Document Released: 10/04/2011 Document Revised: 09/10/2015 Document Reviewed: 09/26/2013 Elsevier Interactive Patient Education  2018 [employer]. Exercising to [employer] Exercising regularly is important. It has many health benefits, such as:  Improving your overall fitness, flexibility, and endurance.  Increasing your bone density.  Helping with weight control.  Decreasing your body fat.  Increasing your muscle strength.  Reducing stress and tension.  Improving your overall health.  In order to become healthy and stay healthy,  it is recommended that you do moderate-intensity and vigorous-intensity exercise. You can tell that you are exercising at a moderate intensity if you have a higher heart rate and faster breathing, but you are still able to hold a conversation. You can tell that you are exercising at a vigorous  intensity if you are breathing much harder and faster and cannot hold a conversation while exercising. How often should I exercise? Choose an activity that you enjoy and set realistic goals. Your health care provider can help you to make an activity plan that works for you. Exercise regularly as directed by your health care provider. This may include:  Doing resistance training twice each week, such as: ? Push-ups. ? Sit-ups. ? Lifting weights. ? Using resistance bands.  Doing a given intensity of exercise for a given amount of time. Choose from these options: ? 150 minutes of moderate-intensity exercise every week. ? 75 minutes of vigorous-intensity exercise every week. ? A mix of moderate-intensity and vigorous-intensity exercise every week.  Children, pregnant women, people who are out of shape, people who are overweight, and older adults may need to consult a health care provider for individual recommendations. If you have any sort of medical condition, be sure to consult your health care provider before starting a new exercise program. What are some exercise ideas? Some moderate-intensity exercise ideas include:  Walking at a rate of 1 mile in 15 minutes.  Biking.  Hiking.  Golfing.  Dancing.  Some vigorous-intensity exercise ideas include:  Walking at a rate of at least 4.5 miles per hour.  Jogging or running at a rate of 5 miles per hour.  Biking at a rate of at least 10 miles per hour.  Lap swimming.  Roller-skating or in-line skating.  Cross-country skiing.  Vigorous competitive sports, such as football, basketball, and soccer.  Jumping rope.  Aerobic dancing.  What are some everyday activities that can help me to get exercise?  Yard work, such as: ? Pushing a Surveyor, mining. ? Raking and bagging leaves.  Washing and waxing your car.  Pushing a stroller.  Shoveling snow.  Gardening.  Washing windows or floors. How can I be more active in my  day-to-day activities?  Use the stairs instead of the elevator.  Take a walk during your lunch break.  If you drive, park your car farther away from work or school.  If you take public transportation, get off one stop early and walk the rest of the way.  Make all of your phone calls while standing up and walking around.  Get up, stretch, and walk around every 30 minutes throughout the day. What guidelines should I follow while exercising?  Do not exercise so much that you hurt yourself, feel dizzy, or get very short of breath.  Consult your health care provider before starting a new exercise program.  Wear comfortable clothes and shoes with good support.  Drink plenty of water while you exercise to prevent dehydration or heat stroke. Body water is lost during exercise and must be replaced.  Work out until you breathe faster and your heart beats faster. This information is not intended to replace advice given to you by your health care provider. Make sure you discuss any questions you have with your health care provider. Document Released: 05/07/2010 Document Revised: 09/10/2015 Document Reviewed: 09/05/2013 Elsevier Interactive Patient Education  [employer].

## 2017-12-27 ENCOUNTER — Other Ambulatory Visit: Payer: Self-pay | Admitting: Family Medicine

## 2017-12-27 DIAGNOSIS — E041 Nontoxic single thyroid nodule: Secondary | ICD-10-CM

## 2018-01-15 ENCOUNTER — Ambulatory Visit
Admission: RE | Admit: 2018-01-15 | Discharge: 2018-01-15 | Disposition: A | Discharge status: Home / Self Care | Payer: Self-pay | Source: Ambulatory Visit | Attending: Family Medicine | Admitting: Family Medicine

## 2018-01-15 DIAGNOSIS — E041 Nontoxic single thyroid nodule: Secondary | ICD-10-CM

## 2018-05-07 MED FILL — IBUPROFEN 800 MG TAB: 800 | 10 days supply | Qty: 30 | Fill #0

## 2018-05-07 MED FILL — metroNIDAZOLE 500 MG TABS: 500 | 10 days supply | Qty: 30 | Fill #0

## 2018-05-07 MED FILL — SULFAMETHOXAZOLE-TMP DS TAB: 800-160 | 10 days supply | Qty: 20 | Fill #0

## 2018-05-08 ENCOUNTER — Other Ambulatory Visit: Payer: Self-pay | Admitting: Family Medicine

## 2018-05-08 ENCOUNTER — Ambulatory Visit
Admission: RE | Admit: 2018-05-08 | Discharge: 2018-05-08 | Disposition: A | Discharge status: Home / Self Care | Payer: Self-pay | Source: Ambulatory Visit | Attending: Family Medicine | Admitting: Family Medicine

## 2018-05-08 DIAGNOSIS — R1032 Left lower quadrant pain: Secondary | ICD-10-CM

## 2018-05-08 MED ORDER — IOPAMIDOL (ISOVUE-300) INJECTION 61%
125.0000 mL | Freq: Once | INTRAVENOUS | Status: AC | PRN
  Administered 2018-05-08: 125 mL via INTRAVENOUS

## 2018-09-22 ENCOUNTER — Other Ambulatory Visit: Payer: Self-pay | Admitting: *Deleted

## 2018-09-22 DIAGNOSIS — Z20822 Contact with and (suspected) exposure to covid-19: Secondary | ICD-10-CM

## 2018-09-24 LAB — NOVEL CORONAVIRUS, NAA: SARS-CoV-2, NAA: NOT DETECTED

## 2018-09-25 ENCOUNTER — Telehealth: Payer: Self-pay | Admitting: *Deleted

## 2018-09-25 NOTE — Telephone Encounter (Signed)
Pt called back and negative test results of the covid-19 given to her with verbal understanding. Pt is aware of symptoms of covid-19. Denies having symptoms.

## 2019-01-01 ENCOUNTER — Ambulatory Visit (INDEPENDENT_AMBULATORY_CARE_PROVIDER_SITE_OTHER): Payer: No Typology Code available for payment source

## 2019-01-01 ENCOUNTER — Ambulatory Visit (INDEPENDENT_AMBULATORY_CARE_PROVIDER_SITE_OTHER): Payer: No Typology Code available for payment source | Admitting: Orthopaedic Surgery

## 2019-01-01 ENCOUNTER — Ambulatory Visit: Payer: Self-pay

## 2019-01-01 ENCOUNTER — Encounter: Payer: Self-pay | Admitting: Orthopaedic Surgery

## 2019-01-01 VITALS — Ht 66.0 in | Wt 238.0 lb

## 2019-01-01 DIAGNOSIS — M170 Bilateral primary osteoarthritis of knee: Secondary | ICD-10-CM

## 2019-01-01 MED ORDER — METHYLPREDNISOLONE ACETATE 40 MG/ML IJ SUSP
40.0000 mg | INTRAMUSCULAR | Status: AC | PRN
  Administered 2019-01-01: 40 mg via INTRA_ARTICULAR

## 2019-01-01 MED ORDER — LIDOCAINE HCL 1 % IJ SOLN
2.0000 mL | INTRAMUSCULAR | Status: AC | PRN
  Administered 2019-01-01: 2 mL

## 2019-01-01 MED ORDER — BUPIVACAINE HCL 0.25 % IJ SOLN
2.0000 mL | INTRAMUSCULAR | Status: AC | PRN
  Administered 2019-01-01: 2 mL via INTRA_ARTICULAR

## 2019-01-01 NOTE — Progress Notes (Signed)
Office Visit Note   Patient: Jenny Carroll           Date of Birth: Aug 30, 1965           MRN: [id number] Visit Date: 01/01/2019              Requested by: Harlan Stains, MD [address] [place],  [place] [address] PCP: Harlan Stains, MD   Assessment & Plan: Visit Diagnoses:  1. Bilateral primary osteoarthritis of knee     Plan: Impression is bilateral knee osteoarthritis primarily to the patellofemoral joint left greater than right.  We will inject the left knee with cortisone today.  If she notices significant improvement of symptoms she would like to return for cortisone injection to the right knee.  She will follow-up with [place] as needed otherwise.  Call with concerns or questions.  Follow-Up Instructions: Return if symptoms worsen or fail to improve.   Orders:  Orders Placed This Encounter  Procedures  . Large Joint Inj: L knee  . XR KNEE 3 VIEW LEFT  . XR KNEE 3 VIEW RIGHT   No orders of the defined types were placed in this encounter.     Procedures: Large Joint Inj: L knee on 01/01/2019 8:37 AM Indications: pain Details: 22 G needle, anterolateral approach Medications: 2 mL bupivacaine 0.25 %; 2 mL lidocaine 1 %; 40 mg methylPREDNISolone acetate 40 MG/ML      Clinical Data: No additional findings.   Subjective: Chief Complaint  Patient presents with  . Right Knee - Pain  . Left Knee - Pain    HPI patient is a pleasant 53 year old female who presents our clinic today with bilateral knee pain left greater than right.  This is been ongoing for the past year and has progressively worsened.  No known injury or change in activity, but she does note that she lives in an apartment has to go up and down quite a few stairs on a daily basis.  The pain is to the anterior knee and is described as an intermittent ache with certain activities.  Pain is worsened with going from a seated to standing position as well as with squatting, hills or stairs.  No  mechanical symptoms.  She has been taking Tylenol and occasional anti-inflammatory with minimal relief of symptoms.  She denies any radicular symptoms.  No previous cortisone injection or surgical intervention either knee.  Review of Systems as detailed in HPI.  All others reviewed and are negative.   Objective: Vital Signs: Ht 5\' 6"  (1.676 m)   Wt 238 lb (108 kg)   BMI 38.41 kg/m   Physical Exam well-developed well-nourished female no acute distress.  Alert and oriented x3.  Ortho Exam examination of both knees reveals a trace effusion on the left.  No effusion on the right.  Range of motion 0-1 25.  Lateral joint line tenderness both sides.  Mild to moderate patellofemoral crepitus both sides.  She is neurovascular intact distally.  Specialty Comments:  No specialty comments available.  Imaging: Xr Knee 3 View Left  Result Date: 01/01/2019 X-rays demonstrate marked degenerative changes patellofemoral compartment and joint space narrowing medial compartment  Xr Knee 3 View Right  Result Date: 01/01/2019 X-rays demonstrate marked degenerative changes patellofemoral compartment and joint space narrowing medial compartment     PMFS History: There are no active problems to display for this patient.  Past Medical History:  Diagnosis Date  . Anemia    history of  anemai, better since no periods anymore  . Blood transfusion without reported diagnosis 1994    Family History  Problem Relation Age of Onset  . Heart disease Maternal Uncle   . Diabetes Maternal Grandmother   . Colon cancer Neg Hx   . Rectal cancer Neg Hx   . Stomach cancer Neg Hx   . Esophageal cancer Neg Hx     Past Surgical History:  Procedure Laterality Date  . CESAREAN SECTION     x3   Social History   Occupational History  . Not on file  Tobacco Use  . Smoking status: Never Smoker  . Smokeless tobacco: Never Used  Substance and Sexual Activity  . Alcohol use: No    Alcohol/week: 0.0 standard  drinks  . Drug use: No  . Sexual activity: Yes

## 2019-01-16 ENCOUNTER — Other Ambulatory Visit: Payer: Self-pay | Admitting: Family Medicine

## 2019-01-16 DIAGNOSIS — E041 Nontoxic single thyroid nodule: Secondary | ICD-10-CM

## 2019-02-04 ENCOUNTER — Other Ambulatory Visit: Payer: No Typology Code available for payment source

## 2019-03-21 MED FILL — ALBUTEROL SULFATE HFA 108 (: 108 (90 BAS | 16 days supply | Qty: 18 | Fill #0

## 2019-05-28 NOTE — Telephone Encounter (Signed)
Error

## 2019-09-16 IMAGING — CT CT ABD-PELV W/ CM
1 of 3 series · 14 of 32 positions shown, 19 images · IV contrast (APPLIED)
Comparison: None.

CLINICAL DATA: Left lower quadrant pain 4 5 days.

EXAM:
CT ABDOMEN AND PELVIS WITH CONTRAST
TECHNIQUE: Multidetector CT imaging of the abdomen and pelvis was performed
using the standard protocol following bolus administration of
intravenous contrast.
CONTRAST:  125mL LFIMOA-SKK IOPAMIDOL (LFIMOA-SKK) INJECTION 61%

[Series 2: abd/pelvis w/cm · axial · 0.91mm/px · z∈[-464,-59]mm · 14 of 93 slices shown, 19 images]
[im 6/93  soft-tissue]
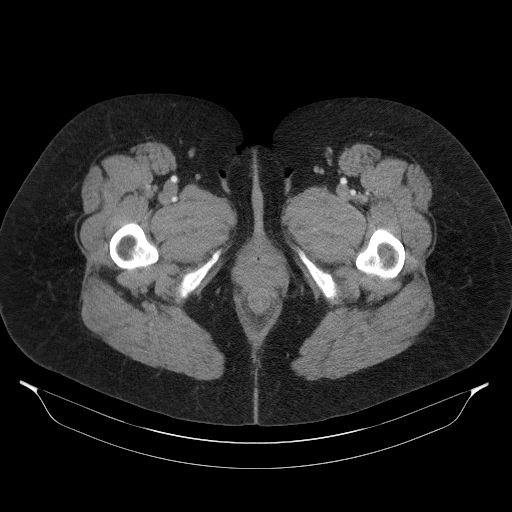
[im 6/93  bone]
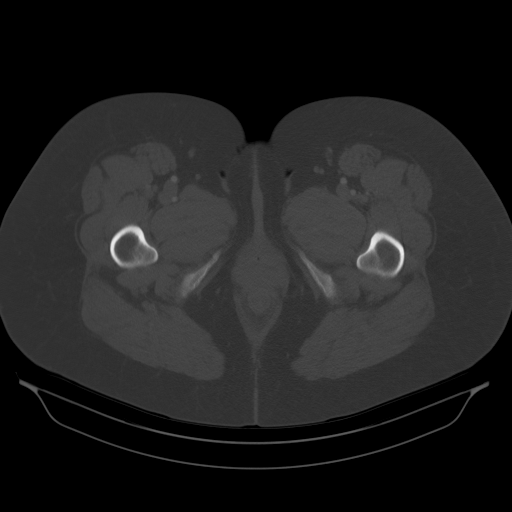
[im 12/93  soft-tissue]
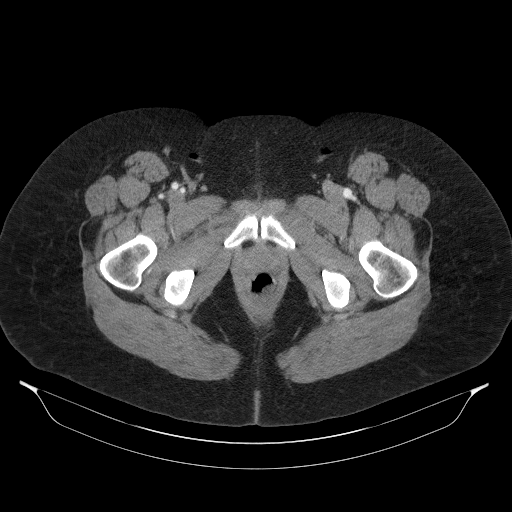
[im 18/93  soft-tissue]
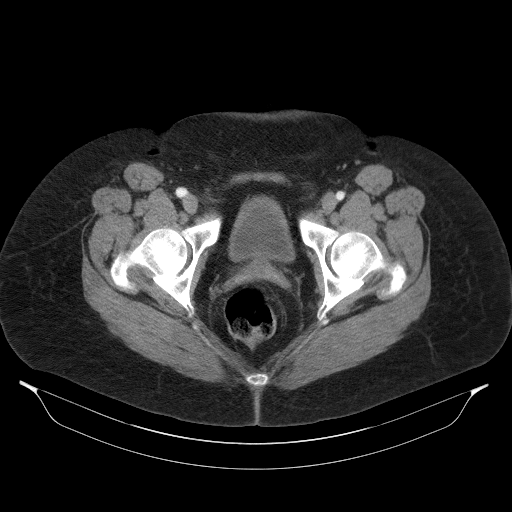
[im 29/93  soft-tissue]
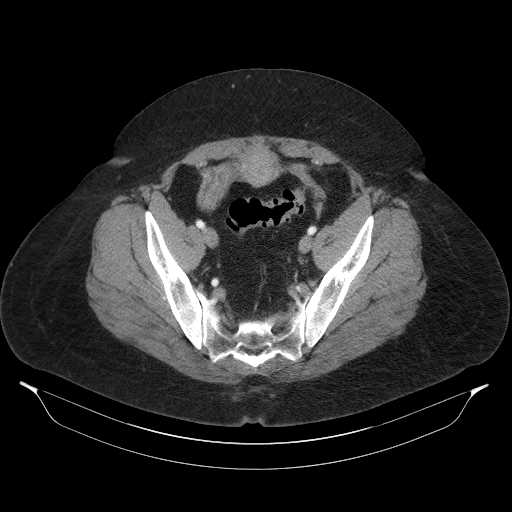
[im 35/93  soft-tissue]
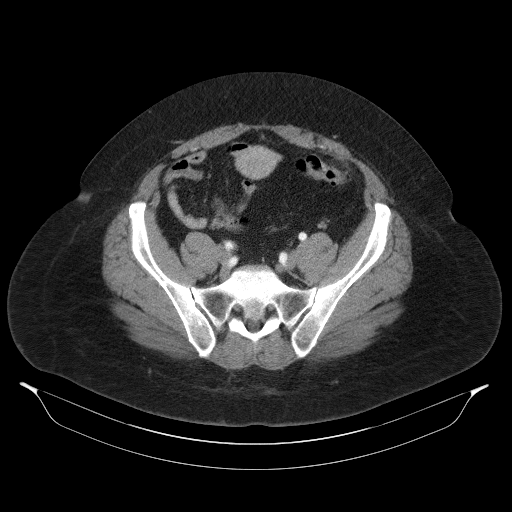
[im 41/93  soft-tissue]
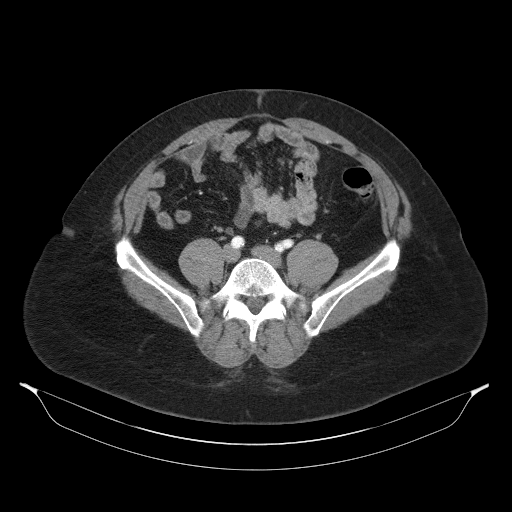
[im 47/93  soft-tissue]
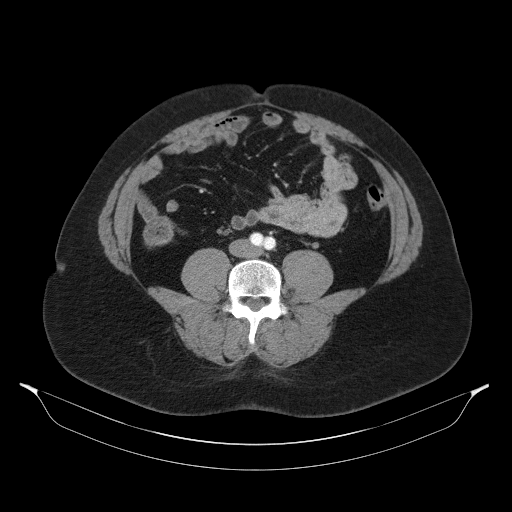
[im 52/93  soft-tissue]
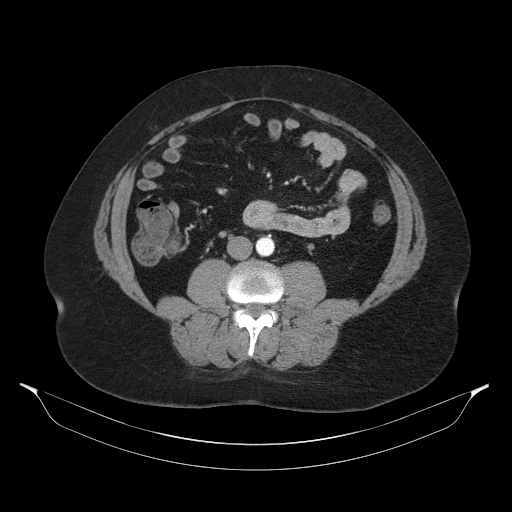
[im 58/93  soft-tissue]
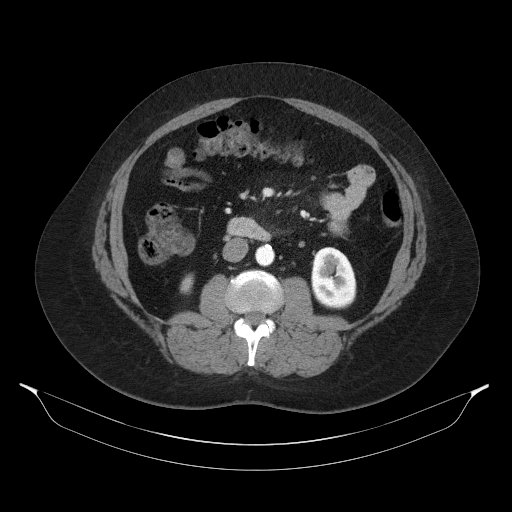
[im 58/93  bone]
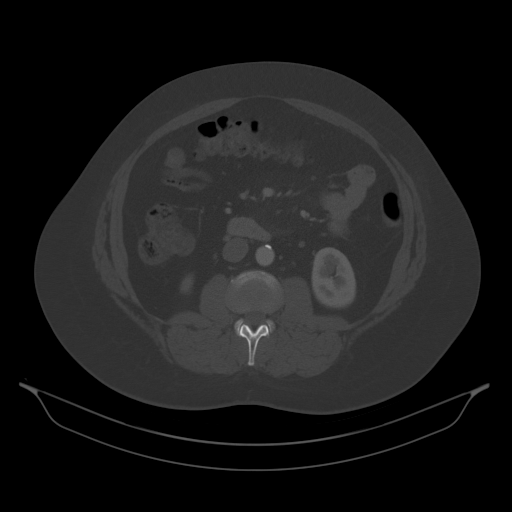
[im 64/93  soft-tissue]
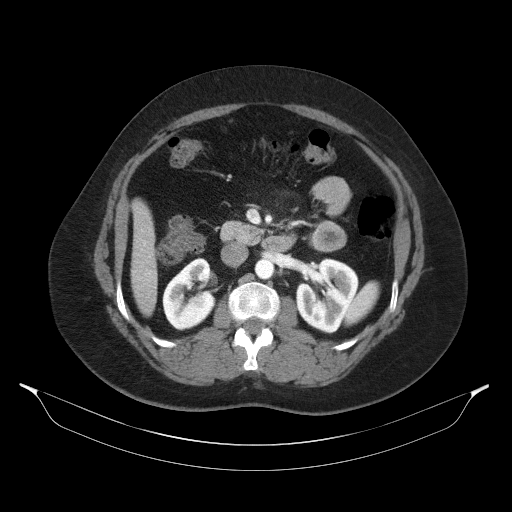
[im 70/93  lung]
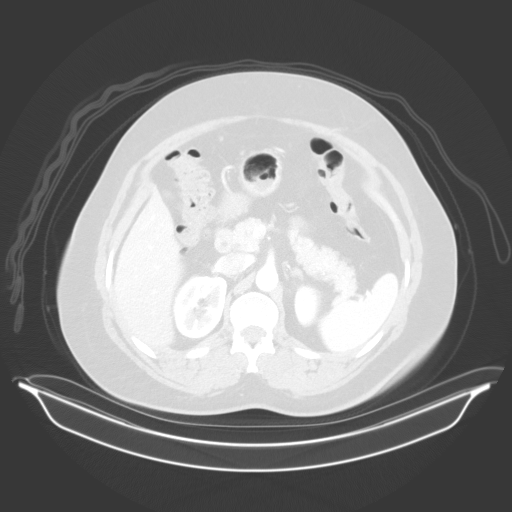
[im 75/93  soft-tissue]
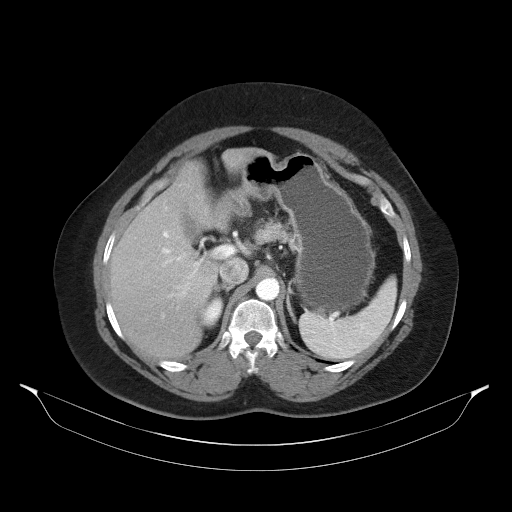
[im 75/93  lung]
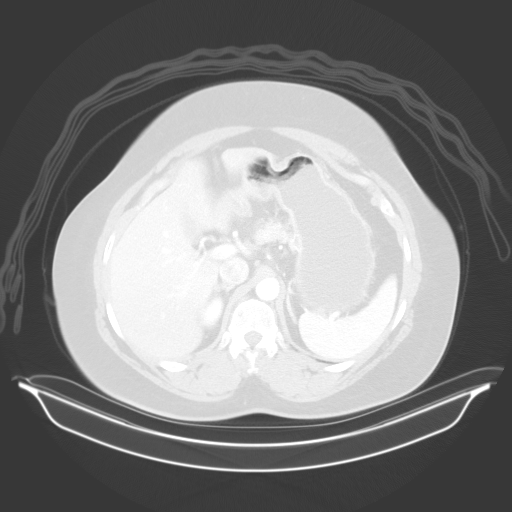
[im 81/93  soft-tissue]
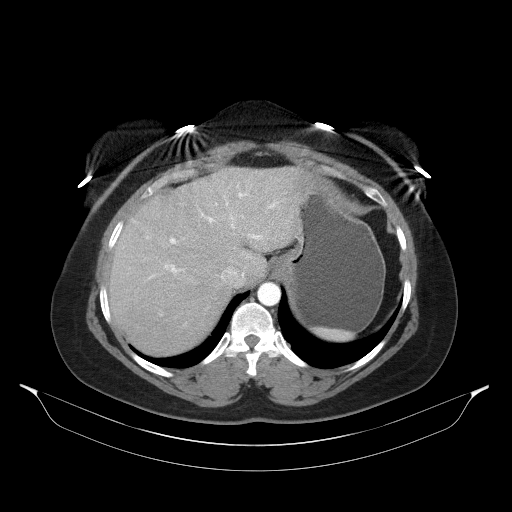
[im 81/93  lung]
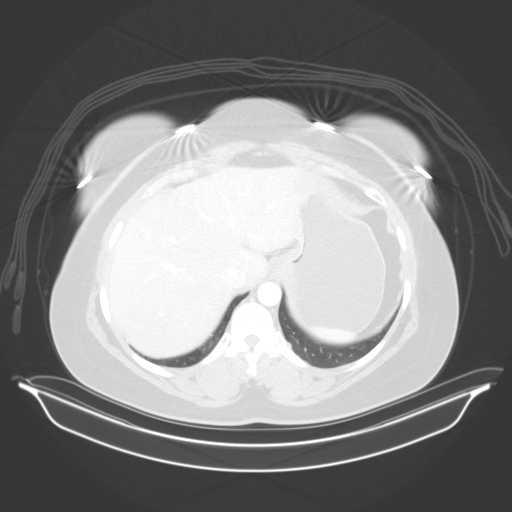
[im 87/93  soft-tissue]
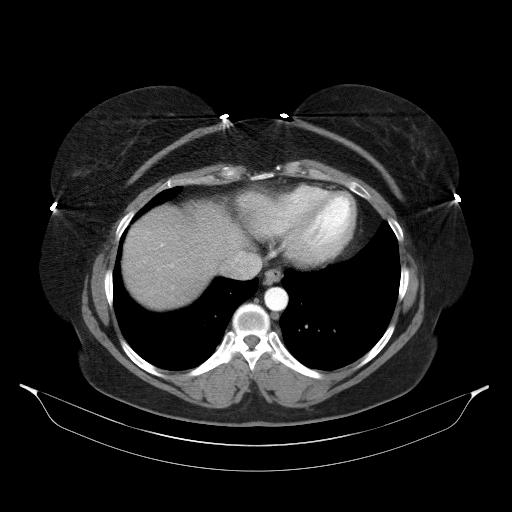
[im 87/93  lung]
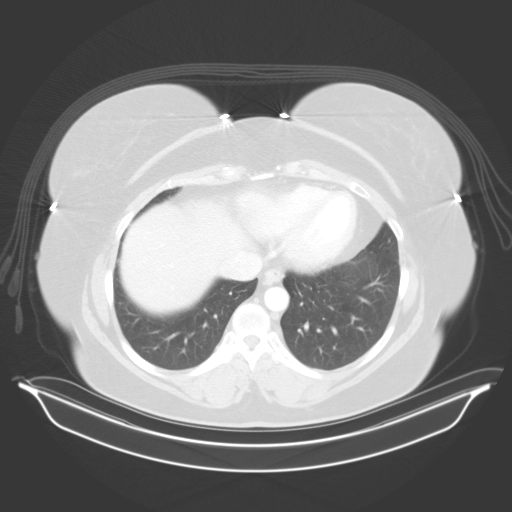

[14 of 32 positions shown; findings below may reference images not displayed]

FINDINGS: Lower chest: No acute abnormality.

Hepatobiliary: Mild diffuse hepatic steatosis. Subcentimeter
hypodensity in the deep right lobe. Unremarkable gallbladder.

Pancreas: Unremarkable

Spleen: Unremarkable

Adrenals/Urinary Tract: Adrenal glands and kidneys are within normal
limits. Bladder is decompressed.

Stomach/Bowel: Normal appendix. No evidence of mass in the colon.
There is very subtle stranding just anterior to the proximal sigmoid
colon on image 59 of series 2. Findings are most consistent with
mild acute diverticulitis. There is no extraluminal bowel gas to
suggest perforation. No evidence of abscess. Stomach is distended
with fluid. No evidence of small-bowel obstruction.

Vascular/Lymphatic: No abnormal retroperitoneal or aortic aneurysm.

Reproductive: The uterus has an elongated appearance. This may be
related to adhesions to a C-section incision. It is otherwise within
normal limits. No adnexal mass.

Other: No free fluid.

Musculoskeletal: No vertebral compression deformity.
IMPRESSION: Above findings are consistent with mild acute sigmoid diverticulitis
without evidence of perforation or abscess formation.

Small hypodensity in the liver. If there is risk of malignancy or
history malignancy, six-month follow-up MRI is recommended.

## 2020-01-15 ENCOUNTER — Other Ambulatory Visit (HOSPITAL_COMMUNITY): Payer: Self-pay | Admitting: Family Medicine

## 2020-01-15 MED FILL — IBUPROFEN 800 MG TABS: 800 | 10 days supply | Qty: 30 | Fill #0

## 2020-01-29 MED FILL — IBUPROFEN 800 MG TABS: 800 | 10 days supply | Qty: 30 | Fill #0

## 2020-06-12 ENCOUNTER — Encounter: Payer: Self-pay | Admitting: Plastic Surgery

## 2020-06-12 ENCOUNTER — Other Ambulatory Visit: Payer: Self-pay

## 2020-06-12 ENCOUNTER — Ambulatory Visit (INDEPENDENT_AMBULATORY_CARE_PROVIDER_SITE_OTHER): Payer: No Typology Code available for payment source | Admitting: Plastic Surgery

## 2020-06-12 DIAGNOSIS — M549 Dorsalgia, unspecified: Secondary | ICD-10-CM | POA: Insufficient documentation

## 2020-06-12 DIAGNOSIS — N62 Hypertrophy of breast: Secondary | ICD-10-CM | POA: Insufficient documentation

## 2020-06-12 DIAGNOSIS — M542 Cervicalgia: Secondary | ICD-10-CM | POA: Diagnosis not present

## 2020-06-12 DIAGNOSIS — M546 Pain in thoracic spine: Secondary | ICD-10-CM

## 2020-06-12 DIAGNOSIS — G8929 Other chronic pain: Secondary | ICD-10-CM

## 2020-06-12 NOTE — Progress Notes (Signed)
Patient ID: Jenny Carroll, female    DOB: Jul 16, 1965, 55 y.o.   MRN: [id number]   Chief Complaint  Patient presents with  . Advice Only    Mammary Hyperplasia: The patient is a 55 y.o. female with a history of mammary hyperplasia for several years.  She has extremely large breasts causing symptoms that include the following: Back pain in the upper and lower back, including neck pain. She pulls or pins her bra straps to provide better lift and relief of the pressure and pain. She notices relief by holding her breast up manually.  Her shoulder straps cause grooves and pain and pressure that requires padding for relief. Pain medication is sometimes required with motrin and tylenol.  Activities that are hindered by enlarged breasts include: exercise and running.  She has tried supportive clothing as well as fitted bras without improvement.  Her breasts are extremely large and fairly symmetric.  She has hyperpigmentation of the inframammary area on both sides.  The sternal to nipple distance on the right is 35 cm and the left is 36 cm.  The IMF distance is seen cm.  She is 5 feet 6 inches tall and weighs 236 pounds.  Preoperative bra size = 40+DDD cup.  She would like to be a C/D cup.  The estimated excess breast tissue to be removed at the time of surgery = 680 grams on the left and 680 grams on the right.  Mammogram history: March 2021 and was negative and she is scheduled for 1 next month.  Family history of breast cancer: Negative.  Tobacco use: None.  She does not have a history of diabetes.   Review of Systems  Constitutional: Positive for activity change. Negative for appetite change.  HENT: Negative.   Eyes: Negative.   Respiratory: Negative.  Negative for chest tightness.   Cardiovascular: Negative for leg swelling.  Gastrointestinal: Negative for abdominal distention and abdominal pain.  Endocrine: Negative.   Genitourinary: Negative.   Musculoskeletal: Positive for back pain and  neck pain.  Skin: Positive for rash. Negative for color change and wound.  Neurological: Negative.   Hematological: Negative.   Psychiatric/Behavioral: Negative.     Past Medical History:  Diagnosis Date  . Anemia    history of anemai, better since no periods anymore  . Blood transfusion without reported diagnosis 1994    Past Surgical History:  Procedure Laterality Date  . CESAREAN SECTION     x3      Current Outpatient Medications:  .  cholecalciferol (VITAMIN D) 1000 UNITS tablet, Take 1,000 Units by mouth daily., Disp: , Rfl:  .  albuterol (PROVENTIL HFA) 108 (90 Base) MCG/ACT inhaler, Inhale 2 puffs into the lungs every 6 (six) hours as needed for up to 10 days for wheezing or shortness of breath., Disp: 1 Inhaler, Rfl: 0   Objective:   Vitals:   06/12/20 1040  BP: 132/84  Pulse: 75    Physical Exam Vitals and nursing note reviewed.  Constitutional:      Appearance: Normal appearance.  HENT:     Head: Normocephalic and atraumatic.  Cardiovascular:     Rate and Rhythm: Normal rate.     Pulses: Normal pulses.  Pulmonary:     Effort: Pulmonary effort is normal. No respiratory distress.  Abdominal:     General: Abdomen is flat. There is no distension.     Tenderness: There is no abdominal tenderness.  Skin:    General: Skin is  warm.     Capillary Refill: Capillary refill takes less than 2 seconds.  Neurological:     General: No focal deficit present.     Mental Status: She is alert and oriented to person, place, and time.  Psychiatric:        Mood and Affect: Mood normal.        Behavior: Behavior normal.        Thought Content: Thought content normal.     Assessment & Plan:  Symptomatic mammary hypertrophy  Chronic bilateral thoracic back pain  Neck pain  Patient is a good candidate for bilateral breast reduction with possible liposuction.  She will need her updated mammogram.  We will refer for physical therapy.  She knows to call when she has  completed the therapy.  We talked about the scars and changes after surgery.  Pictures were obtained of the patient and placed in the chart with the patient's or guardian's permission.   Jenny Bills Zander Ingham, DO

## 2020-06-17 ENCOUNTER — Other Ambulatory Visit: Payer: Self-pay | Admitting: Family Medicine

## 2020-06-17 DIAGNOSIS — E041 Nontoxic single thyroid nodule: Secondary | ICD-10-CM

## 2020-07-06 ENCOUNTER — Ambulatory Visit: Payer: No Typology Code available for payment source | Attending: Plastic Surgery | Admitting: Physical Therapy

## 2020-07-06 ENCOUNTER — Encounter: Payer: Self-pay | Admitting: Physical Therapy

## 2020-07-06 ENCOUNTER — Other Ambulatory Visit: Payer: Self-pay

## 2020-07-06 ENCOUNTER — Ambulatory Visit: Payer: No Typology Code available for payment source

## 2020-07-06 DIAGNOSIS — M542 Cervicalgia: Secondary | ICD-10-CM | POA: Diagnosis not present

## 2020-07-06 DIAGNOSIS — M62830 Muscle spasm of back: Secondary | ICD-10-CM | POA: Diagnosis present

## 2020-07-06 DIAGNOSIS — R293 Abnormal posture: Secondary | ICD-10-CM | POA: Diagnosis present

## 2020-07-06 NOTE — Therapy (Signed)
[facility] [address] [place], [place], [address] Phone: [phone]   Fax:  [phone]  Physical Therapy Evaluation  Patient Details  Name: Jenny Carroll MRN: [id number] Date of Birth: 05/02/1965 Referring Provider (PT): Peggye Form, DO   Encounter Date: 07/06/2020   PT End of Session - 07/06/20 1101    Visit Number 1    Number of Visits 7    Date for PT Re-Evaluation 08/31/20    Authorization Type [facility] U[facility]    PT Start Time 1100    PT Stop Time 1138    PT Time Calculation (min) 38 min    Activity Tolerance Patient tolerated treatment well    Behavior During Therapy [facility] for tasks assessed/performed           Past Medical History:  Diagnosis Date  . Anemia    history of anemai, better since no periods anymore  . Blood transfusion without reported diagnosis 1994    Past Surgical History:  Procedure Laterality Date  . CESAREAN SECTION     x3    There were no vitals filed for this visit.    Subjective Assessment - 07/06/20 1105    Subjective pt is a 55 y.o F with CC of neck and back pain that has been going on for about 5 years and has progressively worsening especially in the shoulders. she denies any referred symptoms and denies any occurence of HA.    How long can you sit comfortably? n/a    How long can you stand comfortably? n/a    How long can you walk comfortably? n/a    Diagnostic tests n/a    Patient Stated Goals to promote healing after the surgery, and get back to normal.    Currently in Pain? Yes    Pain Score 5     Pain Location Back    Pain Orientation Left;Right    Pain Descriptors / Indicators --   uncomofortable.   Pain Type Chronic pain    Pain Onset More than a month ago    Pain Frequency Constant    Aggravating Factors  bending, sitting for long periods    Pain Relieving Factors getting up and move around              [facility] PT Assessment - 07/06/20 0001      Assessment    Medical Diagnosis Symptomatic mammary hypertrophy (N62), Chronic bilateral thoracic back pain (M54.6, G89.29), Neck pain (M54.2)    Referring Provider (PT) Dillingham, Alena Bills, DO    Onset Date/Surgical Date --   5 years   Hand Dominance Right    Next MD Visit make on after PT    Prior Therapy no      Precautions   Precautions None      Restrictions   Weight Bearing Restrictions No      Balance Screen   Has the patient fallen in the past 6 months No      Prior Function   Level of Independence Independent    Vocation Full time employment   medical records   Vocation Requirements prolonged sitting      Cognition   Overall Cognitive Status Within Functional Limits for tasks assessed      ROM / Strength   AROM / PROM / Strength AROM;PROM;Strength      AROM   Overall AROM Comments shoulder WFL    AROM Assessment Site Shoulder;Cervical    Right/Left Shoulder Right;Left  Cervical Flexion 12   concordant symptoms at end range   Cervical Extension 58    Cervical - Right Side Bend 52    Cervical - Left Side Bend 52    Cervical - Right Rotation 44    Cervical - Left Rotation 54      Strength   Strength Assessment Site Shoulder    Right/Left Shoulder Right;Left    Right Shoulder Flexion 4+/5    Right Shoulder Extension 4/5    Right Shoulder ABduction 4/5    Right Shoulder Internal Rotation 5/5    Left Shoulder Flexion 4+/5    Left Shoulder Extension 4+/5    Left Shoulder ABduction 4/5    Left Shoulder Internal Rotation 4/5    Left Shoulder External Rotation 5/5      Palpation   Palpation comment TTP along bil upper trap and levator as well and cervical parapsinals and suboccipitals with mulitple triggers points.                      Objective measurements completed on examination: See above findings.       [facility] Adult PT Treatment/Exercise - 07/06/20 0001      Manual Therapy   Manual therapy comments MTPR along bil upper trap/ levator scapuale       Neck Exercises: Stretches   Upper Trapezius Stretch Right;Left;1 rep;30 seconds    Levator Stretch Right;Left;1 rep;30 seconds                  PT Education - 07/06/20 1139    Education Details evaluation findings, POC, goals, HEP with proper form/ rationale, anatomy of the area involved.    Person(s) Educated Patient    Methods Explanation;Verbal cues;Handout    Comprehension Verbalized understanding;Verbal cues required            PT Short Term Goals - 07/06/20 1147      PT SHORT TERM GOAL #1   Title pt to be IND with inital HEP    Time 6    Period Weeks    Status New    Target Date 07/27/20             PT Long Term Goals - 07/06/20 1147      PT LONG TERM GOAL #1   Title pt to verbalize/ demo efficient posture and lifting mechanics to reduce and prevent neck/ back pain    Time 6    Period Weeks    Status New    Target Date 08/17/20      PT LONG TERM GOAL #2   Title pt to report reduction in bil upper trap and surrounding musculature to reduce pain and improve cervical mobility.    Time 6    Period Weeks    Status New    Target Date 08/17/20      PT LONG TERM GOAL #3   Title increase bil shoulder extensor strength to >/= 4+/5 to to promote and maintain efficeint seated posture    Time 6    Period Weeks    Status New    Target Date 08/17/20      PT LONG TERM GOAL #4   Title pt to be Ind with all HEP given and is able to maintain and progress current LOF IND    Time 6    Period Weeks    Status New    Target Date 08/17/20  Plan - 07/06/20 1140    Clinical Impression Statement pt is a pleasant 55 y.o F prsenting to oppt with CC of neck/ shoulder and upper back pain for 5 years with no specific MOI. she has functional shoulder and neck ROM execpt for mild limtation noted with cervical flexion with reproduction of concordant symptoms. TTP noted along bil upper trap/ levator scapulae and cervical parapsinals/ sub-occipitals  which she responsed well to trigger point release and stretching. She would benefit from physical therapy to decrease neck/ back pain, reduce muscle tension and maximize function by addressing the deficits listed.    Stability/Clinical Decision Making Stable/Uncomplicated    Clinical Decision Making Low    Rehab Potential Good    PT Frequency 1x / week    PT Duration 6 weeks    PT Treatment/Interventions ADLs/Self Care Home Management;Cryotherapy;Electrical Stimulation;Iontophoresis 4mg /ml Dexamethasone;Moist Heat;Traction;Ultrasound;Gait training;Stair training;Functional mobility training;Therapeutic activities;Therapeutic exercise;Balance training;Neuromuscular re-education;Patient/family education;Manual techniques;Dry needling;Passive range of motion;Taping    PT Next Visit Plan review/ update HEP, review posture and provide handout. STW along the upper trap/ levator, strengthening posterior shoulder strengthening,    PT Home Exercise Plan [id number]  chin tuck, upper trap/ levator scapulae stretch, scapular retraction, seated anterior pelvic tilt.    Consulted and Agree with Plan of Care Patient           Patient will benefit from skilled therapeutic intervention in order to improve the following deficits and impairments:  Improper body mechanics,Increased muscle spasms,Pain,Decreased activity tolerance,Decreased endurance,Postural dysfunction  Visit Diagnosis: Cervicalgia  Abnormal posture  Muscle spasm of back     Problem List Patient Active Problem List   Diagnosis Date Noted  . Symptomatic mammary hypertrophy 06/12/2020  . Back pain 06/12/2020  . Neck pain 06/12/2020    06/14/2020 PT, DPT, LAT, ATC  07/06/20  11:53 AM      [facility] [address] [place], [place], [place] Phone: [phone]   Fax:  [phone]  Name: DEYONA SOZA MRN: [name] Date of Birth: [birth year]

## 2020-07-21 ENCOUNTER — Ambulatory Visit: Payer: No Typology Code available for payment source | Admitting: Physical Therapy

## 2020-07-22 ENCOUNTER — Ambulatory Visit: Payer: No Typology Code available for payment source | Attending: Plastic Surgery

## 2020-07-22 ENCOUNTER — Other Ambulatory Visit: Payer: Self-pay

## 2020-07-22 DIAGNOSIS — M62830 Muscle spasm of back: Secondary | ICD-10-CM | POA: Diagnosis present

## 2020-07-22 DIAGNOSIS — M542 Cervicalgia: Secondary | ICD-10-CM | POA: Insufficient documentation

## 2020-07-22 DIAGNOSIS — R293 Abnormal posture: Secondary | ICD-10-CM | POA: Insufficient documentation

## 2020-07-22 NOTE — Patient Instructions (Signed)

## 2020-07-22 NOTE — Therapy (Signed)
[facility] [address] [place], [place], [address] Phone: [phone]   Fax:  [phone]  Physical Therapy Treatment  Patient Details  Name: Jenny Carroll MRN: [id number] Date of Birth: March 30, 1966 Referring Provider (PT): Peggye Form, DO   Encounter Date: 07/22/2020   PT End of Session - 07/22/20 1243    Visit Number 2    Number of Visits 7    Date for PT Re-Evaluation 08/31/20    Authorization Type [facility] U[facility]    PT Start Time 1236   patient late   PT Stop Time 1315    PT Time Calculation (min) 39 min    Activity Tolerance Patient tolerated treatment well    Behavior During Therapy [facility] for tasks assessed/performed           Past Medical History:  Diagnosis Date  . Anemia    history of anemai, better since no periods anymore  . Blood transfusion without reported diagnosis 1994    Past Surgical History:  Procedure Laterality Date  . CESAREAN SECTION     x3    There were no vitals filed for this visit.   Subjective Assessment - 07/22/20 1238    Subjective "Doing pretty good a little pain right here (points to Lt upper trap). I've been trying to do my exercises, just don't do them everyday"    How long can you sit comfortably? n/a    How long can you stand comfortably? n/a    How long can you walk comfortably? n/a    Diagnostic tests n/a    Patient Stated Goals to promote healing after the surgery, and get back to normal.    Currently in Pain? Yes    Pain Score 4     Pain Location --   lt upper trap   Pain Orientation Left    Pain Descriptors / Indicators --   tension   Pain Type Chronic pain    Pain Onset More than a month ago                             [facility] Adult PT Treatment/Exercise - 07/22/20 0001      Neck Exercises: Seated   Neck Retraction 10 reps      Lumbar Exercises: Seated   Other Seated Lumbar Exercises pelvic tilt 2 x 10      Shoulder Exercises: Seated    Retraction 10 reps    Retraction Limitations x 2      Shoulder Exercises: Standing   Extension 10 reps    Theraband Level (Shoulder Extension) Level 2 (Red)    Extension Limitations x 2    Row 10 reps    Theraband Level (Shoulder Row) Level 3 (Green)    Row Limitations x 2      Neck Exercises: Stretches   Upper Trapezius Stretch 30 seconds   bilateral   Levator Stretch 30 seconds   bilateral   Chest Stretch 30 seconds   x 2                 PT Education - 07/22/20 1311    Education Details Posture and body mechanics with daily tasks. Recommended use of golfer's lift to reduce stress on knees and back.    Person(s) Educated Patient    Methods Explanation;Demonstration;Handout    Comprehension Verbalized understanding            PT Short  Term Goals - 07/22/20 1245      PT SHORT TERM GOAL #1   Title pt to be IND with inital HEP    Baseline cues for setup    Time 6    Period Weeks    Status On-going    Target Date 07/27/20             PT Long Term Goals - 07/06/20 1147      PT LONG TERM GOAL #1   Title pt to verbalize/ demo efficient posture and lifting mechanics to reduce and prevent neck/ back pain    Time 6    Period Weeks    Status New    Target Date 08/17/20      PT LONG TERM GOAL #2   Title pt to report reduction in bil upper trap and surrounding musculature to reduce pain and improve cervical mobility.    Time 6    Period Weeks    Status New    Target Date 08/17/20      PT LONG TERM GOAL #3   Title increase bil shoulder extensor strength to >/= 4+/5 to to promote and maintain efficeint seated posture    Time 6    Period Weeks    Status New    Target Date 08/17/20      PT LONG TERM GOAL #4   Title pt to be Ind with all HEP given and is able to maintain and progress current LOF IND    Time 6    Period Weeks    Status New    Target Date 08/17/20                 Plan - 07/22/20 1240    Clinical Impression Statement Reviewed HEP  with patient requiring cues for majority of exercises. She initially performs cervical flexion when attempting cervical retraction though with verbal and tactile cues able to properly perform. Initial cues required to decrease excessive upper trap engagement with scapular retraction with moderate ability to correct once cued. Due to need for cues and patient admitting to not consistently completing HEP since evaluation, no changes were made to HEP today with patient educated on importance of complying with prescribed HEP. Patient reported a reduction in pain at the end of her session.    Stability/Clinical Decision Making --    Rehab Potential --    PT Frequency --    PT Duration --    PT Treatment/Interventions ADLs/Self Care Home Management;Cryotherapy;Electrical Stimulation;Iontophoresis 4mg /ml Dexamethasone;Moist Heat;Traction;Ultrasound;Gait training;Stair training;Functional mobility training;Therapeutic activities;Therapeutic exercise;Balance training;Neuromuscular re-education;Patient/family education;Manual techniques;Dry needling;Passive range of motion;Taping    PT Next Visit Plan review/ update HEP,  STW along the upper trap/ levator, posterior shoulder strengthening, lifting/bending mechanics    PT Home Exercise Plan [id number]  chin tuck, upper trap/ levator scapulae stretch, scapular retraction, seated anterior pelvic tilt.    Consulted and Agree with Plan of Care Patient           Patient will benefit from skilled therapeutic intervention in order to improve the following deficits and impairments:  Improper body mechanics,Increased muscle spasms,Pain,Decreased activity tolerance,Decreased endurance,Postural dysfunction  Visit Diagnosis: Cervicalgia  Abnormal posture  Muscle spasm of back     Problem List Patient Active Problem List   Diagnosis Date Noted  . Symptomatic mammary hypertrophy 06/12/2020  . Back pain 06/12/2020  . Neck pain 06/12/2020   06/14/2020, PT,  DPT, ATC 07/22/20 1:17 PM  [facility] [phone]  [address] [place], [place], [address] Phone: [phone]   Fax:  [phone]  Name: Jenny Carroll MRN: [id number] Date of Birth: 1966/01/20

## 2020-07-28 ENCOUNTER — Other Ambulatory Visit: Payer: Self-pay

## 2020-07-28 ENCOUNTER — Ambulatory Visit: Payer: No Typology Code available for payment source

## 2020-07-28 DIAGNOSIS — R293 Abnormal posture: Secondary | ICD-10-CM

## 2020-07-28 DIAGNOSIS — M542 Cervicalgia: Secondary | ICD-10-CM

## 2020-07-28 DIAGNOSIS — M62830 Muscle spasm of back: Secondary | ICD-10-CM

## 2020-07-28 NOTE — Therapy (Signed)
[facility] [address] [place], [place], [address] Phone: [phone]   Fax:  [phone]  Physical Therapy Treatment  Patient Details  Name: Jenny Carroll MRN: [id number] Date of Birth: 1965-12-11 Referring Provider (PT): Jenny Form, DO   Encounter Date: 07/28/2020   PT End of Session - 07/28/20 1718    Visit Number 3    Number of Visits 7    Date for PT Re-Evaluation 08/31/20    Authorization Type [facility] UMR    PT Start Time 1719    PT Stop Time 1810   MHP at end of session 10 minutes; TPDN 3 minutes   PT Time Calculation (min) 51 min    Activity Tolerance Patient tolerated treatment well    Behavior During Therapy [facility] for tasks assessed/performed           Past Medical History:  Diagnosis Date  . Anemia    history of anemai, better since no periods anymore  . Blood transfusion without reported diagnosis 1994    Past Surgical History:  Procedure Laterality Date  . CESAREAN SECTION     x3    There were no vitals filed for this visit.   Subjective Assessment - 07/28/20 1721    Subjective "Doing good, been doing my work."    Currently in Pain? No/denies                             [facility] Adult PT Treatment/Exercise - 07/28/20 0001      Exercises   Exercises Neck      Neck Exercises: Machines for Strengthening   UBE (Upper Arm Bike) level 3 2 min each fwd/bwd      Neck Exercises: Seated   Neck Retraction 10 reps      Shoulder Exercises: Seated   Retraction 10 reps      Shoulder Exercises: Prone   Retraction 15 reps    Retraction Limitations x 2      Shoulder Exercises: Standing   Extension 10 reps    Theraband Level (Shoulder Extension) Level 3 (Green)    Extension Limitations x 2    Row 15 reps    Theraband Level (Shoulder Row) Level 3 (Green);Level 4 (Blue)    Row Limitations x2; green first set, blue second set      Modalities   Modalities Moist Heat      Moist  Heat Therapy   Number Minutes Moist Heat 10 Minutes    Moist Heat Location Cervical      Manual Therapy   Manual therapy comments STM/DTM/TrP release bilateral upper traps, levator scap            Trigger Point Dry Needling - 07/28/20 0001    Consent Given? Yes    Education Handout Provided Yes    Muscles Treated Head and Neck Upper trapezius    Dry Needling Comments Left upper trap x 2;performed by Anne Ng PT, DPT, LAT, ATC    Upper Trapezius Response Twitch reponse elicited;Palpable increased muscle length                PT Education - 07/28/20 1755    Education Details TPDN indications, expectations, side effects.    Person(s) Educated Patient    Methods Explanation;Handout    Comprehension Verbalized understanding            PT Short Term Goals - 07/22/20 1245  PT SHORT TERM GOAL #1   Title pt to be IND with inital HEP    Baseline cues for setup    Time 6    Period Weeks    Status On-going    Target Date 07/27/20             PT Long Term Goals - 07/06/20 1147      PT LONG TERM GOAL #1   Title pt to verbalize/ demo efficient posture and lifting mechanics to reduce and prevent neck/ back pain    Time 6    Period Weeks    Status New    Target Date 08/17/20      PT LONG TERM GOAL #2   Title pt to report reduction in bil upper trap and surrounding musculature to reduce pain and improve cervical mobility.    Time 6    Period Weeks    Status New    Target Date 08/17/20      PT LONG TERM GOAL #3   Title increase bil shoulder extensor strength to >/= 4+/5 to to promote and maintain efficeint seated posture    Time 6    Period Weeks    Status New    Target Date 08/17/20      PT LONG TERM GOAL #4   Title pt to be Ind with all HEP given and is able to maintain and progress current LOF IND    Time 6    Period Weeks    Status New    Target Date 08/17/20                 Plan - 07/28/20 1718    Clinical Impression Statement  Patient arrives without reports of pain, though continues to have intermittent Lt upper trap pain. Minimal verbal cues required for scapuar retraction as part of initial HEP. Able to progress periscapular strengthening with patient requiring intermittent cues to decrease upper trap engagement. Sound twitch response with TPDN to the Lt upper trap. Overall good tolerance to today's session without reports of pain, though soreness reported post TPDN as expected.    PT Treatment/Interventions ADLs/Self Care Home Management;Cryotherapy;Electrical Stimulation;Iontophoresis 4mg /ml Dexamethasone;Moist Heat;Traction;Ultrasound;Gait training;Stair training;Functional mobility training;Therapeutic activities;Therapeutic exercise;Balance training;Neuromuscular re-education;Patient/family education;Manual techniques;Dry needling;Passive range of motion;Taping    PT Next Visit Plan review/ update HEP,  STW along the upper trap/ levator, posterior shoulder strengthening, lifting/bending mechanics    PT Home Exercise Plan [id number]  chin tuck, upper trap/ levator scapulae stretch, scapular retraction, seated anterior pelvic tilt.    Consulted and Agree with Plan of Care Patient           Patient will benefit from skilled therapeutic intervention in order to improve the following deficits and impairments:  Improper body mechanics,Increased muscle spasms,Pain,Decreased activity tolerance,Decreased endurance,Postural dysfunction  Visit Diagnosis: Cervicalgia  Abnormal posture  Muscle spasm of back     Problem List Patient Active Problem List   Diagnosis Date Noted  . Symptomatic mammary hypertrophy 06/12/2020  . Back pain 06/12/2020  . Neck pain 06/12/2020   06/14/2020, PT, DPT, ATC 07/28/20 6:37 PM  [facility] [address] [place], [place], [place] Phone: [phone]   Fax:  [phone]  Name: Jenny Carroll MRN: [name] Date of Birth:  1966/01/13

## 2020-07-28 NOTE — Patient Instructions (Signed)

## 2020-08-06 ENCOUNTER — Ambulatory Visit: Payer: No Typology Code available for payment source

## 2020-08-06 ENCOUNTER — Other Ambulatory Visit: Payer: Self-pay

## 2020-08-06 DIAGNOSIS — M542 Cervicalgia: Secondary | ICD-10-CM | POA: Diagnosis not present

## 2020-08-06 DIAGNOSIS — M62830 Muscle spasm of back: Secondary | ICD-10-CM

## 2020-08-06 DIAGNOSIS — R293 Abnormal posture: Secondary | ICD-10-CM

## 2020-08-06 NOTE — Therapy (Signed)
[facility] [address] [place], [place], [address] Phone: [phone]   Fax:  [phone]  Physical Therapy Treatment  Patient Details  Name: Jenny Carroll MRN: [id number] Date of Birth: [birth year] Referring Provider (PT): Jenny Form, DO   Encounter Date: 08/06/2020   PT End of Session - 08/06/20 1834    Visit Number 4    Number of Visits 7    Date for PT Re-Evaluation 08/31/20    Authorization Type [facility] UMR    PT Start Time 1831    PT Stop Time 1912    PT Time Calculation (min) 41 min    Activity Tolerance Patient tolerated treatment well    Behavior During Therapy [facility] for tasks assessed/performed           Past Medical History:  Diagnosis Date  . Anemia    history of anemai, better since no periods anymore  . Blood transfusion without reported diagnosis 1994    Past Surgical History:  Procedure Laterality Date  . CESAREAN SECTION     x3    There were no vitals filed for this visit.   Subjective Assessment - 08/06/20 1834    Subjective "I think the needling helped, but it just caused some swelling there. Exercises are going pretty good, still working on that neck thing."    Currently in Pain? No/denies                             [facility] Adult PT Treatment/Exercise - 08/06/20 0001      Neck Exercises: Seated   Neck Retraction 10 reps      Shoulder Exercises: Supine   Horizontal ABduction 10 reps    Theraband Level (Shoulder Horizontal ABduction) Level 2 (Red)    Horizontal ABduction Limitations x 2      Shoulder Exercises: Seated   Retraction 5 reps    External Rotation 10 reps    Theraband Level (Shoulder External Rotation) Level 2 (Red)    External Rotation Limitations x2      Shoulder Exercises: Prone   Retraction 10 reps    Retraction Limitations x 2    Other Prone Exercises Prone T and W 2 x 10      Manual Therapy   Manual therapy comments STM/DTM/TrP release  bilateral upper traps, levator scap                  PT Education - 08/06/20 1905    Education Details Updated HEP.    Person(s) Educated Patient    Methods Explanation;Demonstration;Verbal cues;Handout    Comprehension Verbalized understanding;Returned demonstration;Verbal cues required            PT Short Term Goals - 08/06/20 1859      PT SHORT TERM GOAL #1   Title pt to be IND with inital HEP    Time 6    Period Weeks    Status Achieved    Target Date 07/27/20             PT Long Term Goals - 07/06/20 1147      PT LONG TERM GOAL #1   Title pt to verbalize/ demo efficient posture and lifting mechanics to reduce and prevent neck/ back pain    Time 6    Period Weeks    Status New    Target Date 08/17/20      PT LONG TERM GOAL #2  Title pt to report reduction in bil upper trap and surrounding musculature to reduce pain and improve cervical mobility.    Time 6    Period Weeks    Status New    Target Date 08/17/20      PT LONG TERM GOAL #3   Title increase bil shoulder extensor strength to >/= 4+/5 to to promote and maintain efficeint seated posture    Time 6    Period Weeks    Status New    Target Date 08/17/20      PT LONG TERM GOAL #4   Title pt to be Ind with all HEP given and is able to maintain and progress current LOF IND    Time 6    Period Weeks    Status New    Target Date 08/17/20                 Plan - 08/06/20 1845    Clinical Impression Statement Mild tautness remains in Lt upper trap, though no palpable tenderness. Patient tolerated session well today with continued progression of periscapular strengthening with no reports of pain. She demonstrates independence with initial HEP, so added rows, resisted horizontal abduction, and bilateral resisted shoulder ER to HEP today.    PT Treatment/Interventions ADLs/Self Care Home Management;Cryotherapy;Electrical Stimulation;Iontophoresis 4mg /ml Dexamethasone;Moist  Heat;Traction;Ultrasound;Gait training;Stair training;Functional mobility training;Therapeutic activities;Therapeutic exercise;Balance training;Neuromuscular re-education;Patient/family education;Manual techniques;Dry needling;Passive range of motion;Taping    PT Next Visit Plan review/ update HEP,  STW along the upper trap/ levator, posterior shoulder strengthening, lifting/bending mechanics    PT Home Exercise Plan [id number]  chin tuck, upper trap/ levator scapulae stretch, scapular retraction, seated anterior pelvic tilt.    Consulted and Agree with Plan of Care Patient           Patient will benefit from skilled therapeutic intervention in order to improve the following deficits and impairments:  Improper body mechanics,Increased muscle spasms,Pain,Decreased activity tolerance,Decreased endurance,Postural dysfunction  Visit Diagnosis: Cervicalgia  Abnormal posture  Muscle spasm of back     Problem List Patient Active Problem List   Diagnosis Date Noted  . Symptomatic mammary hypertrophy 06/12/2020  . Back pain 06/12/2020  . Neck pain 06/12/2020   06/14/2020, PT, DPT, ATC 08/06/20 7:16 PM [facility] [address] [place], [place], [place] Phone: [phone]   Fax:  [phone]  Name: Jenny Carroll MRN: [name] Date of Birth: 12-Aug-1965

## 2020-08-10 ENCOUNTER — Ambulatory Visit: Payer: No Typology Code available for payment source

## 2020-08-10 ENCOUNTER — Other Ambulatory Visit: Payer: Self-pay

## 2020-08-10 DIAGNOSIS — M542 Cervicalgia: Secondary | ICD-10-CM

## 2020-08-10 DIAGNOSIS — M62830 Muscle spasm of back: Secondary | ICD-10-CM

## 2020-08-10 DIAGNOSIS — R293 Abnormal posture: Secondary | ICD-10-CM

## 2020-08-10 NOTE — Therapy (Signed)
[facility] [address] [place], [place], [address] Phone: [phone]   Fax:  [phone]  Physical Therapy Treatment  Patient Details  Name: Jenny Carroll MRN: [id number] Date of Birth: 07/15/1965 Referring Provider (PT): Peggye Form, DO   Encounter Date: 08/10/2020   PT End of Session - 08/10/20 1620    Visit Number 5    Number of Visits 7    Date for PT Re-Evaluation 08/31/20    Authorization Type [facility] UMR    PT Start Time 1618    PT Stop Time 1700    PT Time Calculation (min) 42 min    Activity Tolerance Patient tolerated treatment well    Behavior During Therapy [facility] for tasks assessed/performed           Past Medical History:  Diagnosis Date  . Anemia    history of anemai, better since no periods anymore  . Blood transfusion without reported diagnosis 1994    Past Surgical History:  Procedure Laterality Date  . CESAREAN SECTION     x3    There were no vitals filed for this visit.   Subjective Assessment - 08/10/20 1620    Subjective Patient reports her neck and shoulders are feeling good without reports of pain. She denies any pain or soreness after last session.    Currently in Pain? No/denies                             [facility] Adult PT Treatment/Exercise - 08/10/20 0001      Exercises   Exercises Lumbar      Lumbar Exercises: Standing   Other Standing Lumbar Exercises squat with 10# KB 1 x 10; hip hinge with dowel 1 x 10      Shoulder Exercises: Standing   Horizontal ABduction 10 reps    Theraband Level (Shoulder Horizontal ABduction) Level 1 (Yellow)    Horizontal ABduction Limitations x 2    Flexion 10 reps    Shoulder Flexion Weight (lbs) 2    Extension 10 reps    Theraband Level (Shoulder Extension) Level 2 (Red)    Extension Limitations x2; on stability ball    Other Standing Exercises resisted shoulder adduction green band 2 x 10    Other Standing Exercises  serratus wall slide 2 x 10                    PT Short Term Goals - 08/06/20 1859      PT SHORT TERM GOAL #1   Title pt to be IND with inital HEP    Time 6    Period Weeks    Status Achieved    Target Date 07/27/20             PT Long Term Goals - 07/06/20 1147      PT LONG TERM GOAL #1   Title pt to verbalize/ demo efficient posture and lifting mechanics to reduce and prevent neck/ back pain    Time 6    Period Weeks    Status New    Target Date 08/17/20      PT LONG TERM GOAL #2   Title pt to report reduction in bil upper trap and surrounding musculature to reduce pain and improve cervical mobility.    Time 6    Period Weeks    Status New    Target Date 08/17/20  PT LONG TERM GOAL #3   Title increase bil shoulder extensor strength to >/= 4+/5 to to promote and maintain efficeint seated posture    Time 6    Period Weeks    Status New    Target Date 08/17/20      PT LONG TERM GOAL #4   Title pt to be Ind with all HEP given and is able to maintain and progress current LOF IND    Time 6    Period Weeks    Status New    Target Date 08/17/20                 Plan - 08/10/20 1622    Clinical Impression Statement Patient tolerated session well today without reports of pain. Able to further progress periscapular strengthening with patient demonstrating proper form. Began training in appropriate lifting and bending mechanics as patient reports pain can occur when she lifts her grandchildren. With practice and verbal/visual cues patient able to properly perform squat and hip hinge. Anticipate discharge at next session pending independence and tolerance to advanced home program.    PT Treatment/Interventions ADLs/Self Care Home Management;Cryotherapy;Electrical Stimulation;Iontophoresis 4mg /ml Dexamethasone;Moist Heat;Traction;Ultrasound;Gait training;Stair training;Functional mobility training;Therapeutic activities;Therapeutic exercise;Balance  training;Neuromuscular re-education;Patient/family education;Manual techniques;Dry needling;Passive range of motion;Taping    PT Next Visit Plan anticipate D/C, update HEP.    PT Home Exercise Plan [id number]  chin tuck, upper trap/ levator scapulae stretch, scapular retraction, seated anterior pelvic tilt.    Consulted and Agree with Plan of Care Patient           Patient will benefit from skilled therapeutic intervention in order to improve the following deficits and impairments:  Improper body mechanics,Increased muscle spasms,Pain,Decreased activity tolerance,Decreased endurance,Postural dysfunction  Visit Diagnosis: Cervicalgia  Abnormal posture  Muscle spasm of back     Problem List Patient Active Problem List   Diagnosis Date Noted  . Symptomatic mammary hypertrophy 06/12/2020  . Back pain 06/12/2020  . Neck pain 06/12/2020   06/14/2020, PT, DPT, ATC 08/10/20 5:02 PM  [facility] [address] [place], [place], [place] Phone: [phone]   Fax:  [phone]  Name: Jenny Carroll MRN: [name] Date of Birth: 04/23/1965

## 2020-08-11 ENCOUNTER — Encounter: Payer: No Typology Code available for payment source | Admitting: Physical Therapy

## 2020-08-12 ENCOUNTER — Other Ambulatory Visit (HOSPITAL_COMMUNITY): Payer: Self-pay

## 2020-08-12 MED ORDER — HYDROCOD POLST-CPM POLST ER 10-8 MG/5ML PO SUER
5.0000 mL | Freq: Two times a day (BID) | ORAL | 0 refills | Status: DC | PRN
  Filled 2020-08-12: qty 60, 6d supply, fill #0

## 2020-08-12 MED ORDER — ALBUTEROL SULFATE HFA 108 (90 BASE) MCG/ACT IN AERS
2.0000 | INHALATION_SPRAY | RESPIRATORY_TRACT | 0 refills | Status: DC | PRN
  Filled 2020-08-12: qty 18, 17d supply, fill #0

## 2020-08-17 ENCOUNTER — Ambulatory Visit
Admission: RE | Admit: 2020-08-17 | Discharge: 2020-08-17 | Disposition: A | Discharge status: Home / Self Care | Payer: No Typology Code available for payment source | Source: Ambulatory Visit | Attending: Family Medicine | Admitting: Family Medicine

## 2020-08-17 DIAGNOSIS — E041 Nontoxic single thyroid nodule: Secondary | ICD-10-CM

## 2020-08-18 ENCOUNTER — Encounter: Payer: No Typology Code available for payment source | Admitting: Physical Therapy

## 2020-08-19 ENCOUNTER — Ambulatory Visit: Payer: No Typology Code available for payment source | Attending: Plastic Surgery

## 2020-08-19 ENCOUNTER — Other Ambulatory Visit: Payer: Self-pay

## 2020-08-19 DIAGNOSIS — M62830 Muscle spasm of back: Secondary | ICD-10-CM | POA: Insufficient documentation

## 2020-08-19 DIAGNOSIS — R293 Abnormal posture: Secondary | ICD-10-CM | POA: Insufficient documentation

## 2020-08-19 DIAGNOSIS — M542 Cervicalgia: Secondary | ICD-10-CM | POA: Diagnosis not present

## 2020-08-19 NOTE — Therapy (Signed)
[place], [place], [address] Phone: [phone]   Fax:  [phone]  Physical Therapy Treatment/Discharge  Patient Details  Name: Jenny Carroll MRN: [id number] Date of Birth: 12/30/1965 Referring Provider (PT): Wallace Going, DO   Encounter Date: 08/19/2020   PT End of Session - 08/19/20 1107    Visit Number 6    Number of Visits 7    Date for PT Re-Evaluation 08/31/20    Authorization Type [facility] UMR    PT Start Time 1107    PT Stop Time 1145    PT Time Calculation (min) 38 min    Activity Tolerance Patient tolerated treatment well    Behavior During Therapy [facility] for tasks assessed/performed           Past Medical History:  Diagnosis Date  . Anemia    history of anemai, better since no periods anymore  . Blood transfusion without reported diagnosis 1994    Past Surgical History:  Procedure Laterality Date  . CESAREAN SECTION     x3    There were no vitals filed for this visit.   Subjective Assessment - 08/19/20 1109    Subjective Patient reports feeling "a little something" in her Lt upper trap.    Currently in Pain? Yes    Pain Score 4     Pain Location --   Upper trap   Pain Orientation Left    Pain Descriptors / Indicators Tightness    Pain Type Chronic pain    Pain Onset More than a month ago    Pain Frequency Intermittent              [facility] PT Assessment - 08/19/20 0001      AROM   Cervical Flexion 45    Cervical Extension 45    Cervical - Right Side Bend 40    Cervical - Left Side Bend 40    Cervical - Right Rotation 60    Cervical - Left Rotation 55      Strength   Right Shoulder Flexion 5/5    Right Shoulder Extension 5/5    Right Shoulder ABduction 5/5    Right Shoulder Internal Rotation 5/5    Left Shoulder Flexion 5/5    Left Shoulder Extension 5/5    Left Shoulder ABduction 5/5    Left Shoulder Internal Rotation 5/5    Left Shoulder External Rotation  5/5                         [facility] Adult PT Treatment/Exercise - 08/19/20 0001      Self-Care   Self-Care Other Self-Care Comments    Other Self-Care Comments  see patient education      Neck Exercises: Machines for Strengthening   UBE (Upper Arm Bike) level 2, 2 min each fwd/dbwd      Neck Exercises: Seated   Neck Retraction 10 reps      Lumbar Exercises: Seated   Other Seated Lumbar Exercises pelvic tilt 2 x 10      Shoulder Exercises: Seated   External Rotation 15 reps    Theraband Level (Shoulder External Rotation) Level 2 (Red)    External Rotation Limitations x2      Shoulder Exercises: Standing   Horizontal ABduction 10 reps    Theraband Level (Shoulder Horizontal ABduction) Level 2 (Red)    Horizontal ABduction Limitations x2    Row 15 reps  Theraband Level (Shoulder Row) Level 4 (Blue)    Row Limitations x2      Neck Exercises: Stretches   Upper Trapezius Stretch 30 seconds   bilateral   Levator Stretch 30 seconds   bilateral   Other Neck Stretches doorway pec strech 60 sec                  PT Education - 08/19/20 1137    Education Details D/C education. Overall function progress and review of HEP. f/u with referring provider    Person(s) Educated Patient    Methods Explanation;Verbal cues;Handout    Comprehension Verbalized understanding;Returned demonstration            PT Short Term Goals - 08/06/20 1859      PT SHORT TERM GOAL #1   Title pt to be IND with inital HEP    Time 6    Period Weeks    Status Achieved    Target Date 07/27/20             PT Long Term Goals - 08/19/20 1111      PT LONG TERM GOAL #1   Title pt to verbalize/ demo efficient posture and lifting mechanics to reduce and prevent neck/ back pain    Baseline improved posture awareness, appropriate lifting mechanics    Time 6    Period Weeks    Status Achieved      PT LONG TERM GOAL #2   Title pt to report reduction in bil upper trap and  surrounding musculature to reduce pain and improve cervical mobility.    Baseline continued pain rated as 4/10    Time 6    Period Weeks    Status On-going      PT LONG TERM GOAL #3   Title increase bil shoulder extensor strength to >/= 4+/5 to to promote and maintain efficeint seated posture    Time 6    Period Weeks    Status Achieved      PT LONG TERM GOAL #4   Title pt to be Ind with all HEP given and is able to maintain and progress current LOF IND    Time 6    Period Weeks    Status Achieved                 Plan - 08/19/20 1119    Clinical Impression Statement Patient has made good functional progress demonstrating improvements in cervical AROM and shoulder strength since start of care. She has met majority of established functional goals. Her pain continues to fluctuate reported today as 4/10 in Lt upper trap. She has met her maximal rehab potential at this time and is recommended to f/u with referring provider due to ongoing pain. She is therefore appropriate for discharge at this time.    PT Treatment/Interventions ADLs/Self Care Home Management;Cryotherapy;Electrical Stimulation;Iontophoresis 58m/ml Dexamethasone;Moist Heat;Traction;Ultrasound;Gait training;Stair training;Functional mobility training;Therapeutic activities;Therapeutic exercise;Balance training;Neuromuscular re-education;Patient/family education;Manual techniques;Dry needling;Passive range of motion;Taping    PT Next Visit Plan --    PT Home Exercise Plan [id number]  chin tuck, upper trap/ levator scapulae stretch, scapular retraction, seated anterior pelvic tilt.    Consulted and Agree with Plan of Care Patient           Patient will benefit from skilled therapeutic intervention in order to improve the following deficits and impairments:  Improper body mechanics,Increased muscle spasms,Pain,Decreased activity tolerance,Decreased endurance,Postural dysfunction  Visit Diagnosis: Cervicalgia  Abnormal  posture  Muscle spasm of back  Problem List Patient Active Problem List   Diagnosis Date Noted  . Symptomatic mammary hypertrophy 06/12/2020  . Back pain 06/12/2020  . Neck pain 06/12/2020  PHYSICAL THERAPY DISCHARGE SUMMARY  Visits from Start of Care: 6  Current functional level related to goals / functional outcomes: See above   Remaining deficits: See above   Education / Equipment: See above   Plan: Patient agrees to discharge.  Patient goals were partially met. Patient is being discharged due to                                                    max rehab potential ?????         Gwendolyn Grant, PT, DPT, ATC 08/19/20 11:50 AM  [facility] [address] [place], [place], [address] Phone: [phone]   Fax:  [phone]  Name: AMAN BATLEY MRN: [id number] Date of Birth: 12/10/1965

## 2020-08-27 ENCOUNTER — Other Ambulatory Visit (HOSPITAL_COMMUNITY): Payer: Self-pay

## 2020-08-27 MED ORDER — BUDESONIDE-FORMOTEROL FUMARATE 80-4.5 MCG/ACT IN AERO
2.0000 | INHALATION_SPRAY | Freq: Two times a day (BID) | RESPIRATORY_TRACT | 2 refills | Status: DC
  Filled 2020-08-27: qty 10.2, 30d supply, fill #0

## 2020-08-28 ENCOUNTER — Encounter: Payer: Self-pay | Admitting: Surgical

## 2020-08-28 ENCOUNTER — Ambulatory Visit (INDEPENDENT_AMBULATORY_CARE_PROVIDER_SITE_OTHER): Payer: No Typology Code available for payment source | Admitting: Surgical

## 2020-08-28 ENCOUNTER — Other Ambulatory Visit: Payer: Self-pay

## 2020-08-28 VITALS — Wt 231.8 lb

## 2020-08-28 DIAGNOSIS — G8929 Other chronic pain: Secondary | ICD-10-CM

## 2020-08-28 DIAGNOSIS — M542 Cervicalgia: Secondary | ICD-10-CM | POA: Diagnosis not present

## 2020-08-28 DIAGNOSIS — M546 Pain in thoracic spine: Secondary | ICD-10-CM | POA: Diagnosis not present

## 2020-08-28 DIAGNOSIS — N62 Hypertrophy of breast: Secondary | ICD-10-CM

## 2020-08-28 NOTE — Progress Notes (Signed)
Patient is a 55 year old female here for follow-up after completion of physical therapy for her symptomatic mammary hypertrophy.  She initially saw with Dr. Ulice Bold on 06/12/2020 for evaluation of bilateral breast reduction.  She has completed at least 6 visits/weeks of physical therapy.  She reports today she noticed some improvement with physical therapy but reports that she is still having almost daily neck and shoulder pain.  She reports that she does have some back pain but mostly feels pain in her neck and shoulder.  She reports that she did have her mammogram and July 08, 2020 and reports this was negative.  She has also had approximately 5 pound weight loss since her last visit.  She was 236 pounds on 06/12/2020 and today she is 231 pounds.  She is still interested in pursuing surgical intervention for bilateral breast reduction.  Wt 231 lb 12.8 oz (105.1 kg)   BMI 37.41 kg/m  On exam she is in no acute distress, well-developed, well-nourished.  Breathing is unlabored.    Patient is interested in pursuing surgical intervention for bilateral breast reduction surgery.  She is still having shoulder, neck and back pain.  She mostly has shoulder and neck pain and reports that it is almost daily.  She does have some days where she does not have the pain.  We will resubmit this to her insurance company after completion of physical therapy and I discussed with her that our surgical scheduling team would reach out to her once we have a response.

## 2020-11-20 ENCOUNTER — Other Ambulatory Visit (HOSPITAL_COMMUNITY): Payer: Self-pay

## 2020-11-20 ENCOUNTER — Other Ambulatory Visit: Payer: Self-pay

## 2020-11-20 ENCOUNTER — Encounter: Payer: Self-pay | Admitting: Surgical

## 2020-11-20 ENCOUNTER — Ambulatory Visit (INDEPENDENT_AMBULATORY_CARE_PROVIDER_SITE_OTHER): Payer: No Typology Code available for payment source | Admitting: Surgical

## 2020-11-20 VITALS — BP 154/88 | HR 81 | Ht 66.0 in | Wt 235.0 lb

## 2020-11-20 DIAGNOSIS — G8929 Other chronic pain: Secondary | ICD-10-CM

## 2020-11-20 DIAGNOSIS — M542 Cervicalgia: Secondary | ICD-10-CM

## 2020-11-20 DIAGNOSIS — N62 Hypertrophy of breast: Secondary | ICD-10-CM

## 2020-11-20 DIAGNOSIS — M546 Pain in thoracic spine: Secondary | ICD-10-CM

## 2020-11-20 MED ORDER — CEPHALEXIN 500 MG PO CAPS
500.0000 mg | ORAL_CAPSULE | Freq: Four times a day (QID) | ORAL | 0 refills | Status: AC
  Filled 2020-11-20 – 2020-12-08 (×2): qty 12, 3d supply, fill #0

## 2020-11-20 MED ORDER — HYDROCODONE-ACETAMINOPHEN 5-325 MG PO TABS
1.0000 | ORAL_TABLET | Freq: Four times a day (QID) | ORAL | 0 refills | Status: AC | PRN
  Filled 2020-11-20 – 2020-12-08 (×2): qty 20, 5d supply, fill #0

## 2020-11-20 MED ORDER — ONDANSETRON HCL 4 MG PO TABS
4.0000 mg | ORAL_TABLET | Freq: Three times a day (TID) | ORAL | 0 refills | Status: DC | PRN
  Filled 2020-11-20 – 2020-12-08 (×2): qty 20, 7d supply, fill #0

## 2020-11-20 NOTE — Progress Notes (Signed)
Patient ID: Jenny Carroll, female    DOB: 1966-04-09, 55 y.o.   MRN: [id number]  Chief Complaint  Patient presents with   Pre-op Exam      ICD-10-CM   1. Symptomatic mammary hypertrophy  N62     2. Neck pain  M54.2     3. Chronic bilateral thoracic back pain  M54.6    G89.29        History of Present Illness: Jenny Carroll is a 55 y.o.  female  with a history of macromastia.  She presents for preoperative evaluation for upcoming procedure, Bilateral Breast Reduction with possible, scheduled for 12/09/2020 with Dr.  Ulice Bold  The patient has not had problems with anesthesia. No history of DVT/PE.  No family history of DVT/PE.  No family or personal history of bleeding or clotting disorders.  Patient is not currently taking any blood thinners.  No history of CVA/MI.   Summary of Previous Visit: STN on the right is 35 and STN on the left is 36.  Preoperative bra size is 40+ triple D cup.  She would like to be a C/D cup.  Mammogram in March 2021 which was negative.  No tobacco use.  No diabetic history.  Estimated excess breast tissue to be removed at time of surgery: 680 g on the left and 680 g on the right.  Job: Works for [employer], desk job  No significant past medical history.  Reports she has been feeling well lately.  No recent changes in her health.  Past Medical History: Allergies: No Known Allergies  Current Medications:  Current Outpatient Medications:    albuterol (VENTOLIN HFA) 108 (90 Base) MCG/ACT inhaler, Inhale 2 puffs into the lungs every 4 hours as needed for shortness of breath/wheezing, Disp: 18 g, Rfl: 0   budesonide-formoterol (SYMBICORT) 80-4.5 MCG/ACT inhaler, Inhale 2 puffs into the lungs 2 (two) times daily., Disp: 10.2 g, Rfl: 2   chlorpheniramine-HYDROcodone (TUSSIONEX) 10-8 MG/5ML SUER, Take 5 mLs by mouth every 12 (twelve) hours as needed., Disp: 60 mL, Rfl: 0   cholecalciferol (VITAMIN D) 1000 UNITS tablet, Take 1,000 Units by mouth daily.,  Disp: , Rfl:    ibuprofen (ADVIL) 800 MG tablet, TAKE 1 TABLET BY MOUTH THREE TIMES DAILY AS NEEDED WITH FOOD OR MILK., Disp: 30 tablet, Rfl: 1   albuterol (PROVENTIL HFA) 108 (90 Base) MCG/ACT inhaler, Inhale 2 puffs into the lungs every 6 (six) hours as needed for up to 10 days for wheezing or shortness of breath., Disp: 1 Inhaler, Rfl: 0  Past Medical Problems: Past Medical History:  Diagnosis Date   Anemia    history of anemai, better since no periods anymore   Blood transfusion without reported diagnosis 1994    Past Surgical History: Past Surgical History:  Procedure Laterality Date   CESAREAN SECTION     x3    Social History: Social History   Socioeconomic History   Marital status: Single    Spouse name: Not on file   Number of children: Not on file   Years of education: Not on file   Highest education level: Not on file  Occupational History   Not on file  Tobacco Use   Smoking status: Never   Smokeless tobacco: Never  Substance and Sexual Activity   Alcohol use: No    Alcohol/week: 0.0 standard drinks   Drug use: No   Sexual activity: Yes  Other Topics Concern   Not on file  Social History Narrative   Not on file   Social Determinants of Health   Financial Resource Strain: Not on file  Food Insecurity: Not on file  Transportation Needs: Not on file  Physical Activity: Not on file  Stress: Not on file  Social Connections: Not on file  Intimate Partner Violence: Not on file    Family History: Family History  Problem Relation Age of Onset   Heart disease Maternal Uncle    Diabetes Maternal Grandmother    Colon cancer Neg Hx    Rectal cancer Neg Hx    Stomach cancer Neg Hx    Esophageal cancer Neg Hx     Review of Systems: Review of Systems  Constitutional: Negative.   Respiratory: Negative.    Cardiovascular: Negative.   Gastrointestinal: Negative.   Neurological: Negative.    Physical Exam: Vital Signs BP (!) 154/88 (BP Location: Left  Arm, Patient Position: Sitting, Cuff Size: Large)   Pulse 81   Ht 5\' 6"  (1.676 m)   Wt 235 lb (106.6 kg)   SpO2 97%   BMI 37.93 kg/m   Physical Exam  Constitutional:      General: Not in acute distress.    Appearance: Normal appearance. Not ill-appearing.  HENT:     Head: Normocephalic and atraumatic.  Eyes:     Pupils: Pupils are equal, round Neck:     Musculoskeletal: Normal range of motion.  Cardiovascular:     Rate and Rhythm: Normal rate    Pulses: Normal pulses.  Pulmonary:     Effort: Pulmonary effort is normal. No respiratory distress.  Abdominal:     General: Abdomen is flat. There is no distension.  Musculoskeletal: Normal range of motion.  Skin:    General: Skin is warm and dry.     Findings: No erythema or rash.  Neurological:     General: No focal deficit present.     Mental Status: Alert and oriented to person, place, and time. Mental status is at baseline.     Motor: No weakness.  Psychiatric:        Mood and Affect: Mood normal.        Behavior: Behavior normal.    Assessment/Plan: The patient is scheduled for bilateral breast reduction with Dr. .  Risks, benefits, and alternatives of procedure discussed, questions answered and consent obtained.    Smoking Status: Non-smoker; Counseling Given?  N/A Last Mammogram: 07/05/2020; Results: Negative  Caprini Score: 4, moderate; Risk Factors include: Age, BMI greater than 25, and length of planned surgery. Recommendation for mechanical prophylaxis. Encourage early ambulation.   Pictures obtained:@[email or web address]   Post-op Rx sent to pharmacy: Norco, Zofran, Keflex  Patient was provided with the breast reduction and General Surgical Risk consent document and Pain Medication Agreement prior to their appointment.  They had adequate time to read through the risk consent documents and Pain Medication Agreement. We also discussed them in person together during this preop appointment. All of their questions were  answered to their satisfaction.  Recommended calling if they have any further questions.  Risk consent form and Pain Medication Agreement to be scanned into patient's chart.  The risk that can be encountered with breast reduction were discussed and include the following but not limited to these:  Breast asymmetry, fluid accumulation, firmness of the breast, inability to breast feed, loss of nipple or areola, skin loss, decrease or no nipple sensation, fat necrosis of the breast tissue, bleeding, infection, healing delay.  There are risks of  anesthesia, changes to skin sensation and injury to nerves or blood vessels.  The muscle can be temporarily or permanently injured.  You may have an allergic reaction to tape, suture, glue, blood products which can result in skin discoloration, swelling, pain, skin lesions, poor healing.  Any of these can lead to the need for revisonal surgery or stage procedures.  A reduction has potential to interfere with diagnostic procedures.  Nipple or breast piercing can increase risks of infection.  This procedure is best done when the breast is fully developed.  Changes in the breast will continue to occur over time.  Pregnancy can alter the outcomes of previous breast reduction surgery, weight gain and weigh loss can also effect the long term appearance.   The risks that can be encountered with and after liposuction were discussed and include the following but no limited to these:  Asymmetry, fluid accumulation, firmness of the area, fat necrosis with death of fat tissue, bleeding, infection, delayed healing, anesthesia risks, skin sensation changes, injury to structures including nerves, blood vessels, and muscles which may be temporary or permanent, allergies to tape, suture materials and glues, blood products, topical preparations or injected agents, skin and contour irregularities, skin discoloration and swelling, deep vein thrombosis, cardiac and pulmonary complications, pain,  which may persist, persistent pain, recurrence of the lesion, poor healing of the incision, possible need for revisional surgery or staged procedures. Thiere can also be persistent swelling, poor wound healing, rippling or loose skin, worsening of cellulite, swelling, and thermal burn or heat injury from ultrasound with the ultrasound-assisted lipoplasty technique. Any change in weight fluctuations can alter the outcome.   Electronically signed by: Kermit Balo Nolawi Kanady, PA-C 11/20/2020 10:22 AM

## 2020-11-20 NOTE — H&P (View-Only) (Signed)
Patient ID: Jenny Carroll, female    DOB: 1966-04-09, 55 y.o.   MRN: [id number]  Chief Complaint  Patient presents with   Pre-op Exam      ICD-10-CM   1. Symptomatic mammary hypertrophy  N62     2. Neck pain  M54.2     3. Chronic bilateral thoracic back pain  M54.6    G89.29        History of Present Illness: Jenny Carroll is a 55 y.o.  female  with a history of macromastia.  She presents for preoperative evaluation for upcoming procedure, Bilateral Breast Reduction with possible, scheduled for 12/09/2020 with Dr.  Ulice Bold  The patient has not had problems with anesthesia. No history of DVT/PE.  No family history of DVT/PE.  No family or personal history of bleeding or clotting disorders.  Patient is not currently taking any blood thinners.  No history of CVA/MI.   Summary of Previous Visit: STN on the right is 35 and STN on the left is 36.  Preoperative bra size is 40+ triple D cup.  She would like to be a C/D cup.  Mammogram in March 2021 which was negative.  No tobacco use.  No diabetic history.  Estimated excess breast tissue to be removed at time of surgery: 680 g on the left and 680 g on the right.  Job: Works for [employer], desk job  No significant past medical history.  Reports she has been feeling well lately.  No recent changes in her health.  Past Medical History: Allergies: No Known Allergies  Current Medications:  Current Outpatient Medications:    albuterol (VENTOLIN HFA) 108 (90 Base) MCG/ACT inhaler, Inhale 2 puffs into the lungs every 4 hours as needed for shortness of breath/wheezing, Disp: 18 g, Rfl: 0   budesonide-formoterol (SYMBICORT) 80-4.5 MCG/ACT inhaler, Inhale 2 puffs into the lungs 2 (two) times daily., Disp: 10.2 g, Rfl: 2   chlorpheniramine-HYDROcodone (TUSSIONEX) 10-8 MG/5ML SUER, Take 5 mLs by mouth every 12 (twelve) hours as needed., Disp: 60 mL, Rfl: 0   cholecalciferol (VITAMIN D) 1000 UNITS tablet, Take 1,000 Units by mouth daily.,  Disp: , Rfl:    ibuprofen (ADVIL) 800 MG tablet, TAKE 1 TABLET BY MOUTH THREE TIMES DAILY AS NEEDED WITH FOOD OR MILK., Disp: 30 tablet, Rfl: 1   albuterol (PROVENTIL HFA) 108 (90 Base) MCG/ACT inhaler, Inhale 2 puffs into the lungs every 6 (six) hours as needed for up to 10 days for wheezing or shortness of breath., Disp: 1 Inhaler, Rfl: 0  Past Medical Problems: Past Medical History:  Diagnosis Date   Anemia    history of anemai, better since no periods anymore   Blood transfusion without reported diagnosis 1994    Past Surgical History: Past Surgical History:  Procedure Laterality Date   CESAREAN SECTION     x3    Social History: Social History   Socioeconomic History   Marital status: Single    Spouse name: Not on file   Number of children: Not on file   Years of education: Not on file   Highest education level: Not on file  Occupational History   Not on file  Tobacco Use   Smoking status: Never   Smokeless tobacco: Never  Substance and Sexual Activity   Alcohol use: No    Alcohol/week: 0.0 standard drinks   Drug use: No   Sexual activity: Yes  Other Topics Concern   Not on file  Social History Narrative   Not on file   Social Determinants of Health   Financial Resource Strain: Not on file  Food Insecurity: Not on file  Transportation Needs: Not on file  Physical Activity: Not on file  Stress: Not on file  Social Connections: Not on file  Intimate Partner Violence: Not on file    Family History: Family History  Problem Relation Age of Onset   Heart disease Maternal Uncle    Diabetes Maternal Grandmother    Colon cancer Neg Hx    Rectal cancer Neg Hx    Stomach cancer Neg Hx    Esophageal cancer Neg Hx     Review of Systems: Review of Systems  Constitutional: Negative.   Respiratory: Negative.    Cardiovascular: Negative.   Gastrointestinal: Negative.   Neurological: Negative.    Physical Exam: Vital Signs BP (!) 154/88 (BP Location: Left  Arm, Patient Position: Sitting, Cuff Size: Large)   Pulse 81   Ht 5\' 6"  (1.676 m)   Wt 235 lb (106.6 kg)   SpO2 97%   BMI 37.93 kg/m   Physical Exam  Constitutional:      General: Not in acute distress.    Appearance: Normal appearance. Not ill-appearing.  HENT:     Head: Normocephalic and atraumatic.  Eyes:     Pupils: Pupils are equal, round Neck:     Musculoskeletal: Normal range of motion.  Cardiovascular:     Rate and Rhythm: Normal rate    Pulses: Normal pulses.  Pulmonary:     Effort: Pulmonary effort is normal. No respiratory distress.  Abdominal:     General: Abdomen is flat. There is no distension.  Musculoskeletal: Normal range of motion.  Skin:    General: Skin is warm and dry.     Findings: No erythema or rash.  Neurological:     General: No focal deficit present.     Mental Status: Alert and oriented to person, place, and time. Mental status is at baseline.     Motor: No weakness.  Psychiatric:        Mood and Affect: Mood normal.        Behavior: Behavior normal.    Assessment/Plan: The patient is scheduled for bilateral breast reduction with Dr. .  Risks, benefits, and alternatives of procedure discussed, questions answered and consent obtained.    Smoking Status: Non-smoker; Counseling Given?  N/A Last Mammogram: 07/05/2020; Results: Negative  Caprini Score: 4, moderate; Risk Factors include: Age, BMI greater than 25, and length of planned surgery. Recommendation for mechanical prophylaxis. Encourage early ambulation.   Pictures obtained:@Consult   Post-op Rx sent to pharmacy: Norco, Zofran, Keflex  Patient was provided with the breast reduction and General Surgical Risk consent document and Pain Medication Agreement prior to their appointment.  They had adequate time to read through the risk consent documents and Pain Medication Agreement. We also discussed them in person together during this preop appointment. All of their questions were  answered to their satisfaction.  Recommended calling if they have any further questions.  Risk consent form and Pain Medication Agreement to be scanned into patient's chart.  The risk that can be encountered with breast reduction were discussed and include the following but not limited to these:  Breast asymmetry, fluid accumulation, firmness of the breast, inability to breast feed, loss of nipple or areola, skin loss, decrease or no nipple sensation, fat necrosis of the breast tissue, bleeding, infection, healing delay.  There are risks of  anesthesia, changes to skin sensation and injury to nerves or blood vessels.  The muscle can be temporarily or permanently injured.  You may have an allergic reaction to tape, suture, glue, blood products which can result in skin discoloration, swelling, pain, skin lesions, poor healing.  Any of these can lead to the need for revisonal surgery or stage procedures.  A reduction has potential to interfere with diagnostic procedures.  Nipple or breast piercing can increase risks of infection.  This procedure is best done when the breast is fully developed.  Changes in the breast will continue to occur over time.  Pregnancy can alter the outcomes of previous breast reduction surgery, weight gain and weigh loss can also effect the long term appearance.   The risks that can be encountered with and after liposuction were discussed and include the following but no limited to these:  Asymmetry, fluid accumulation, firmness of the area, fat necrosis with death of fat tissue, bleeding, infection, delayed healing, anesthesia risks, skin sensation changes, injury to structures including nerves, blood vessels, and muscles which may be temporary or permanent, allergies to tape, suture materials and glues, blood products, topical preparations or injected agents, skin and contour irregularities, skin discoloration and swelling, deep vein thrombosis, cardiac and pulmonary complications, pain,  which may persist, persistent pain, recurrence of the lesion, poor healing of the incision, possible need for revisional surgery or staged procedures. Thiere can also be persistent swelling, poor wound healing, rippling or loose skin, worsening of cellulite, swelling, and thermal burn or heat injury from ultrasound with the ultrasound-assisted lipoplasty technique. Any change in weight fluctuations can alter the outcome.   Electronically signed by: Kermit Balo Sruti Ayllon, PA-C 11/20/2020 10:22 AM

## 2020-11-28 ENCOUNTER — Other Ambulatory Visit (HOSPITAL_COMMUNITY): Payer: Self-pay

## 2020-12-01 ENCOUNTER — Encounter (HOSPITAL_BASED_OUTPATIENT_CLINIC_OR_DEPARTMENT_OTHER): Payer: Self-pay | Admitting: Plastic Surgery

## 2020-12-01 ENCOUNTER — Other Ambulatory Visit: Payer: Self-pay

## 2020-12-08 ENCOUNTER — Other Ambulatory Visit (HOSPITAL_COMMUNITY): Payer: Self-pay

## 2020-12-09 ENCOUNTER — Other Ambulatory Visit: Payer: Self-pay

## 2020-12-09 ENCOUNTER — Ambulatory Visit (HOSPITAL_BASED_OUTPATIENT_CLINIC_OR_DEPARTMENT_OTHER)
Admission: RE | Admit: 2020-12-09 | Discharge: 2020-12-09 | Disposition: A | Discharge status: Home / Self Care | Payer: No Typology Code available for payment source | Attending: Plastic Surgery | Admitting: Plastic Surgery

## 2020-12-09 ENCOUNTER — Ambulatory Visit (HOSPITAL_BASED_OUTPATIENT_CLINIC_OR_DEPARTMENT_OTHER): Payer: No Typology Code available for payment source | Admitting: Anesthesiology

## 2020-12-09 ENCOUNTER — Encounter (HOSPITAL_BASED_OUTPATIENT_CLINIC_OR_DEPARTMENT_OTHER)
Admission: RE | Disposition: A | Discharge status: Home / Self Care | Payer: Self-pay | Source: Home / Self Care | Attending: Plastic Surgery

## 2020-12-09 ENCOUNTER — Encounter (HOSPITAL_BASED_OUTPATIENT_CLINIC_OR_DEPARTMENT_OTHER): Payer: Self-pay | Admitting: Plastic Surgery

## 2020-12-09 DIAGNOSIS — N62 Hypertrophy of breast: Secondary | ICD-10-CM

## 2020-12-09 DIAGNOSIS — Z7951 Long term (current) use of inhaled steroids: Secondary | ICD-10-CM | POA: Diagnosis not present

## 2020-12-09 DIAGNOSIS — M542 Cervicalgia: Secondary | ICD-10-CM | POA: Diagnosis not present

## 2020-12-09 DIAGNOSIS — G8929 Other chronic pain: Secondary | ICD-10-CM | POA: Diagnosis not present

## 2020-12-09 DIAGNOSIS — M546 Pain in thoracic spine: Secondary | ICD-10-CM | POA: Diagnosis not present

## 2020-12-09 HISTORY — PX: BREAST REDUCTION SURGERY: SHX8

## 2020-12-09 SURGERY — BREAST REDUCTION WITH LIPOSUCTION
Anesthesia: General | Site: Breast | Laterality: Bilateral

## 2020-12-09 MED ORDER — LIDOCAINE-EPINEPHRINE 1 %-1:100000 IJ SOLN
INTRAMUSCULAR | Status: AC
  Filled 2020-12-09: qty 1

## 2020-12-09 MED ORDER — CHLORHEXIDINE GLUCONATE CLOTH 2 % EX PADS: 6.0000 | MEDICATED_PAD | Freq: Once | CUTANEOUS | Status: DC

## 2020-12-09 MED ORDER — MIDAZOLAM HCL 2 MG/2ML IJ SOLN
INTRAMUSCULAR | Status: AC
  Filled 2020-12-09: qty 2

## 2020-12-09 MED ORDER — LIDOCAINE HCL 1 % IJ SOLN
INTRAVENOUS | Status: DC | PRN
  Administered 2020-12-09: 800 mL

## 2020-12-09 MED ORDER — ACETAMINOPHEN 325 MG RE SUPP: 650.0000 mg | RECTAL | Status: DC | PRN

## 2020-12-09 MED ORDER — ATROPINE SULFATE 0.4 MG/ML IJ SOLN
INTRAMUSCULAR | Status: AC
  Filled 2020-12-09: qty 1

## 2020-12-09 MED ORDER — PHENYLEPHRINE HCL (PRESSORS) 10 MG/ML IV SOLN
INTRAVENOUS | Status: DC | PRN
  Administered 2020-12-09: 80 ug via INTRAVENOUS

## 2020-12-09 MED ORDER — BUPIVACAINE HCL (PF) 0.25 % IJ SOLN
INTRAMUSCULAR | Status: AC
  Filled 2020-12-09: qty 30

## 2020-12-09 MED ORDER — PROPOFOL 10 MG/ML IV BOLUS
INTRAVENOUS | Status: DC | PRN
  Administered 2020-12-09: 200 mg via INTRAVENOUS

## 2020-12-09 MED ORDER — OXYCODONE HCL 5 MG PO TABS
ORAL_TABLET | ORAL | Status: AC
  Filled 2020-12-09: qty 1

## 2020-12-09 MED ORDER — DIPHENHYDRAMINE HCL 50 MG/ML IJ SOLN
INTRAMUSCULAR | Status: AC
  Filled 2020-12-09: qty 1

## 2020-12-09 MED ORDER — EPINEPHRINE PF 1 MG/ML IJ SOLN
INTRAMUSCULAR | Status: AC
  Filled 2020-12-09: qty 1

## 2020-12-09 MED ORDER — SODIUM CHLORIDE 0.9 % IV SOLN: 250.0000 mL | INTRAVENOUS | Status: DC | PRN

## 2020-12-09 MED ORDER — EPHEDRINE 5 MG/ML INJ
INTRAVENOUS | Status: AC
  Filled 2020-12-09: qty 10

## 2020-12-09 MED ORDER — ONDANSETRON HCL 4 MG/2ML IJ SOLN: 4.0000 mg | Freq: Once | INTRAMUSCULAR | Status: DC | PRN

## 2020-12-09 MED ORDER — KETOROLAC TROMETHAMINE 30 MG/ML IJ SOLN
INTRAMUSCULAR | Status: AC
  Filled 2020-12-09: qty 1

## 2020-12-09 MED ORDER — LIDOCAINE HCL (PF) 1 % IJ SOLN
INTRAMUSCULAR | Status: AC
  Filled 2020-12-09: qty 60

## 2020-12-09 MED ORDER — KETOROLAC TROMETHAMINE 30 MG/ML IJ SOLN
30.0000 mg | Freq: Once | INTRAMUSCULAR | Status: AC | PRN
  Administered 2020-12-09: 30 mg via INTRAVENOUS

## 2020-12-09 MED ORDER — SUCCINYLCHOLINE CHLORIDE 200 MG/10ML IV SOSY
PREFILLED_SYRINGE | INTRAVENOUS | Status: AC
  Filled 2020-12-09: qty 10

## 2020-12-09 MED ORDER — OXYCODONE HCL 5 MG PO TABS: 5.0000 mg | ORAL_TABLET | ORAL | Status: DC | PRN

## 2020-12-09 MED ORDER — PHENYLEPHRINE 40 MCG/ML (10ML) SYRINGE FOR IV PUSH (FOR BLOOD PRESSURE SUPPORT)
PREFILLED_SYRINGE | INTRAVENOUS | Status: AC
  Filled 2020-12-09: qty 10

## 2020-12-09 MED ORDER — EPHEDRINE SULFATE 50 MG/ML IJ SOLN
INTRAMUSCULAR | Status: DC | PRN
  Administered 2020-12-09: 10 mg via INTRAVENOUS

## 2020-12-09 MED ORDER — ONDANSETRON HCL 4 MG/2ML IJ SOLN
INTRAMUSCULAR | Status: DC | PRN
  Administered 2020-12-09: 4 mg via INTRAVENOUS

## 2020-12-09 MED ORDER — FENTANYL CITRATE (PF) 100 MCG/2ML IJ SOLN: 25.0000 ug | INTRAMUSCULAR | Status: DC | PRN

## 2020-12-09 MED ORDER — MIDAZOLAM HCL 5 MG/5ML IJ SOLN
INTRAMUSCULAR | Status: DC | PRN
  Administered 2020-12-09: 2 mg via INTRAVENOUS

## 2020-12-09 MED ORDER — SODIUM CHLORIDE 0.9% FLUSH: 3.0000 mL | INTRAVENOUS | Status: DC | PRN

## 2020-12-09 MED ORDER — DEXAMETHASONE SODIUM PHOSPHATE 10 MG/ML IJ SOLN
INTRAMUSCULAR | Status: AC
  Filled 2020-12-09: qty 1

## 2020-12-09 MED ORDER — ROCURONIUM BROMIDE 100 MG/10ML IV SOLN
INTRAVENOUS | Status: DC | PRN
  Administered 2020-12-09: 80 mg via INTRAVENOUS

## 2020-12-09 MED ORDER — DEXAMETHASONE SODIUM PHOSPHATE 4 MG/ML IJ SOLN
INTRAMUSCULAR | Status: DC | PRN
  Administered 2020-12-09: 10 mg via INTRAVENOUS

## 2020-12-09 MED ORDER — ONDANSETRON HCL 4 MG/2ML IJ SOLN
INTRAMUSCULAR | Status: AC
  Filled 2020-12-09: qty 2

## 2020-12-09 MED ORDER — ROCURONIUM BROMIDE 10 MG/ML (PF) SYRINGE
PREFILLED_SYRINGE | INTRAVENOUS | Status: AC
  Filled 2020-12-09: qty 10

## 2020-12-09 MED ORDER — LIDOCAINE-EPINEPHRINE 1 %-1:100000 IJ SOLN
INTRAMUSCULAR | Status: DC | PRN
  Administered 2020-12-09: 50 mL

## 2020-12-09 MED ORDER — OXYCODONE HCL 5 MG PO TABS
5.0000 mg | ORAL_TABLET | Freq: Once | ORAL | Status: AC | PRN
  Administered 2020-12-09: 5 mg via ORAL

## 2020-12-09 MED ORDER — MEPERIDINE HCL 25 MG/ML IJ SOLN: 6.2500 mg | INTRAMUSCULAR | Status: DC | PRN

## 2020-12-09 MED ORDER — DIPHENHYDRAMINE HCL 50 MG/ML IJ SOLN
INTRAMUSCULAR | Status: DC | PRN
  Administered 2020-12-09: 12.5 mg via INTRAVENOUS

## 2020-12-09 MED ORDER — FENTANYL CITRATE (PF) 100 MCG/2ML IJ SOLN
INTRAMUSCULAR | Status: AC
  Filled 2020-12-09: qty 2

## 2020-12-09 MED ORDER — FENTANYL CITRATE (PF) 100 MCG/2ML IJ SOLN
INTRAMUSCULAR | Status: DC | PRN
  Administered 2020-12-09: 100 ug via INTRAVENOUS
  Administered 2020-12-09 (×2): 50 ug via INTRAVENOUS

## 2020-12-09 MED ORDER — FENTANYL CITRATE (PF) 100 MCG/2ML IJ SOLN
25.0000 ug | INTRAMUSCULAR | Status: DC | PRN
  Administered 2020-12-09 (×2): 50 ug via INTRAVENOUS

## 2020-12-09 MED ORDER — ACETAMINOPHEN 325 MG PO TABS: 325.0000 mg | ORAL_TABLET | ORAL | Status: DC | PRN

## 2020-12-09 MED ORDER — CEFAZOLIN SODIUM-DEXTROSE 2-4 GM/100ML-% IV SOLN
INTRAVENOUS | Status: AC
  Filled 2020-12-09: qty 100

## 2020-12-09 MED ORDER — LIDOCAINE HCL (PF) 2 % IJ SOLN
INTRAMUSCULAR | Status: AC
  Filled 2020-12-09: qty 5

## 2020-12-09 MED ORDER — SUGAMMADEX SODIUM 200 MG/2ML IV SOLN
INTRAVENOUS | Status: DC | PRN
  Administered 2020-12-09: 200 mg via INTRAVENOUS

## 2020-12-09 MED ORDER — OXYCODONE HCL 5 MG/5ML PO SOLN: 5.0000 mg | Freq: Once | ORAL | Status: AC | PRN

## 2020-12-09 MED ORDER — LIDOCAINE HCL (CARDIAC) PF 100 MG/5ML IV SOSY
PREFILLED_SYRINGE | INTRAVENOUS | Status: DC | PRN
  Administered 2020-12-09: 100 mg via INTRAVENOUS

## 2020-12-09 MED ORDER — ACETAMINOPHEN 160 MG/5ML PO SOLN: 325.0000 mg | ORAL | Status: DC | PRN

## 2020-12-09 MED ORDER — LACTATED RINGERS IV SOLN: INTRAVENOUS | Status: DC

## 2020-12-09 MED ORDER — ACETAMINOPHEN 325 MG PO TABS: 650.0000 mg | ORAL_TABLET | ORAL | Status: DC | PRN

## 2020-12-09 MED ORDER — SODIUM CHLORIDE 0.9% FLUSH: 3.0000 mL | Freq: Two times a day (BID) | INTRAVENOUS | Status: DC

## 2020-12-09 MED ORDER — CEFAZOLIN SODIUM-DEXTROSE 2-4 GM/100ML-% IV SOLN
2.0000 g | INTRAVENOUS | Status: AC
  Administered 2020-12-09: 2 g via INTRAVENOUS

## 2020-12-09 SURGICAL SUPPLY — 68 items
ADH SKN CLS APL DERMABOND .7 (GAUZE/BANDAGES/DRESSINGS) ×3
BAG DECANTER FOR FLEXI CONT (MISCELLANEOUS) IMPLANT
BINDER BREAST LRG (GAUZE/BANDAGES/DRESSINGS) IMPLANT
BINDER BREAST MEDIUM (GAUZE/BANDAGES/DRESSINGS) IMPLANT
BINDER BREAST XLRG (GAUZE/BANDAGES/DRESSINGS) ×3 IMPLANT
BINDER BREAST XXLRG (GAUZE/BANDAGES/DRESSINGS) IMPLANT
BIOPATCH RED 1 DISK 7.0 (GAUZE/BANDAGES/DRESSINGS) IMPLANT
BIOPATCH RED 1IN DISK 7.0MM (GAUZE/BANDAGES/DRESSINGS)
BLADE HEX COATED 2.75 (ELECTRODE) ×3 IMPLANT
BLADE KNIFE PERSONA 10 (BLADE) ×6 IMPLANT
BLADE SURG 15 STRL LF DISP TIS (BLADE) ×1 IMPLANT
BLADE SURG 15 STRL SS (BLADE) ×3
CANISTER SUCT 1200ML W/VALVE (MISCELLANEOUS) ×3 IMPLANT
COVER BACK TABLE 60X90IN (DRAPES) ×3 IMPLANT
COVER MAYO STAND STRL (DRAPES) ×3 IMPLANT
DECANTER SPIKE VIAL GLASS SM (MISCELLANEOUS) IMPLANT
DERMABOND ADVANCED (GAUZE/BANDAGES/DRESSINGS) ×6
DERMABOND ADVANCED .7 DNX12 (GAUZE/BANDAGES/DRESSINGS) ×3 IMPLANT
DRAIN CHANNEL 19F RND (DRAIN) IMPLANT
DRAPE LAPAROSCOPIC ABDOMINAL (DRAPES) ×3 IMPLANT
DRSG OPSITE POSTOP 4X12 (GAUZE/BANDAGES/DRESSINGS) ×6 IMPLANT
DRSG OPSITE POSTOP 4X6 (GAUZE/BANDAGES/DRESSINGS) IMPLANT
DRSG PAD ABDOMINAL 8X10 ST (GAUZE/BANDAGES/DRESSINGS) ×12 IMPLANT
ELECT BLADE 4.0 EZ CLEAN MEGAD (MISCELLANEOUS)
ELECT REM PT RETURN 9FT ADLT (ELECTROSURGICAL) ×3
ELECTRODE BLDE 4.0 EZ CLN MEGD (MISCELLANEOUS) IMPLANT
ELECTRODE REM PT RTRN 9FT ADLT (ELECTROSURGICAL) ×1 IMPLANT
EVACUATOR SILICONE 100CC (DRAIN) IMPLANT
GLOVE SURG ENC MOIS LTX SZ6 (GLOVE) ×9 IMPLANT
GLOVE SURG ENC MOIS LTX SZ7.5 (GLOVE) ×3 IMPLANT
GLOVE SURG POLYISO LF SZ7 (GLOVE) ×6 IMPLANT
GLOVE SURG UNDER POLY LF SZ7 (GLOVE) ×6 IMPLANT
GOWN STRL REUS W/ TWL LRG LVL3 (GOWN DISPOSABLE) ×2 IMPLANT
GOWN STRL REUS W/ TWL XL LVL3 (GOWN DISPOSABLE) ×2 IMPLANT
GOWN STRL REUS W/TWL LRG LVL3 (GOWN DISPOSABLE) ×6
GOWN STRL REUS W/TWL XL LVL3 (GOWN DISPOSABLE) ×6
LINER CANISTER 1000CC FLEX (MISCELLANEOUS) ×6 IMPLANT
NEEDLE FILTER BLUNT 18X 1/2SAF (NEEDLE) ×2
NEEDLE FILTER BLUNT 18X1 1/2 (NEEDLE) ×1 IMPLANT
NEEDLE HYPO 25X1 1.5 SAFETY (NEEDLE) ×3 IMPLANT
NS IRRIG 1000ML POUR BTL (IV SOLUTION) ×3 IMPLANT
PACK BASIN DAY SURGERY FS (CUSTOM PROCEDURE TRAY) ×3 IMPLANT
PAD ALCOHOL SWAB (MISCELLANEOUS) IMPLANT
PAD FOAM SILICONE BACKED (GAUZE/BANDAGES/DRESSINGS) ×3 IMPLANT
PENCIL SMOKE EVACUATOR (MISCELLANEOUS) ×3 IMPLANT
PIN SAFETY STERILE (MISCELLANEOUS) IMPLANT
SLEEVE SCD COMPRESS KNEE MED (STOCKING) ×3 IMPLANT
SPONGE T-LAP 18X18 ~~LOC~~+RFID (SPONGE) ×15 IMPLANT
STRIP SUTURE WOUND CLOSURE 1/2 (MISCELLANEOUS) ×9 IMPLANT
SUT MNCRL AB 4-0 PS2 18 (SUTURE) ×27 IMPLANT
SUT MON AB 3-0 SH 27 (SUTURE) ×18
SUT MON AB 3-0 SH27 (SUTURE) ×6 IMPLANT
SUT MON AB 5-0 PS2 18 (SUTURE) ×6 IMPLANT
SUT PDS 3-0 CT2 (SUTURE) ×18
SUT PDS II 3-0 CT2 27 ABS (SUTURE) ×6 IMPLANT
SUT SILK 3 0 PS 1 (SUTURE) IMPLANT
SYR 50ML LL SCALE MARK (SYRINGE) ×3 IMPLANT
SYR BULB IRRIG 60ML STRL (SYRINGE) ×3 IMPLANT
SYR CONTROL 10ML LL (SYRINGE) ×3 IMPLANT
TAPE MEASURE VINYL STERILE (MISCELLANEOUS) IMPLANT
TOWEL GREEN STERILE FF (TOWEL DISPOSABLE) ×6 IMPLANT
TRAY DSU PREP LF (CUSTOM PROCEDURE TRAY) ×3 IMPLANT
TUBE CONNECTING 20'X1/4 (TUBING) ×1
TUBE CONNECTING 20X1/4 (TUBING) ×2 IMPLANT
TUBING INFILTRATION IT-10001 (TUBING) ×3 IMPLANT
TUBING SET GRADUATE ASPIR 12FT (MISCELLANEOUS) ×3 IMPLANT
UNDERPAD 30X36 HEAVY ABSORB (UNDERPADS AND DIAPERS) ×6 IMPLANT
YANKAUER SUCT BULB TIP NO VENT (SUCTIONS) ×3 IMPLANT

## 2020-12-09 NOTE — Interval H&P Note (Signed)
History and Physical Interval Note:  12/09/2020 7:02 AM  [name]  has presented today for surgery, with the diagnosis of mammary hypertrophy.  The various methods of treatment have been discussed with the patient and family. After consideration of risks, benefits and other options for treatment, the patient has consented to  Procedure(s) with comments: BREAST REDUCTION WITH LIPOSUCTION (Bilateral) - 3 hours as a surgical intervention.  The patient's history has been reviewed, patient examined, no change in status, stable for surgery.  I have reviewed the patient's chart and labs.  Questions were answered to the patient's satisfaction.     Alena Bills Nakisha Chai

## 2020-12-09 NOTE — Anesthesia Preprocedure Evaluation (Addendum)
Anesthesia Evaluation  Patient identified by MRN, date of birth, ID band Patient awake    Reviewed: Allergy & Precautions, NPO status , Patient's Chart, lab work & pertinent test results  Airway Mallampati: I       Dental  (+) Teeth Intact   Pulmonary asthma ,    Pulmonary exam normal        Cardiovascular negative cardio ROS Normal cardiovascular exam     Neuro/Psych negative neurological ROS  negative psych ROS   GI/Hepatic negative GI ROS, Neg liver ROS,   Endo/Other    Renal/GU   negative genitourinary   Musculoskeletal negative musculoskeletal ROS (+)   Abdominal (+) + obese,   Peds  Hematology   Anesthesia Other Findings   Reproductive/Obstetrics                             Anesthesia Physical Anesthesia Plan  ASA: 2  Anesthesia Plan: General   Post-op Pain Management:    Induction: Intravenous  PONV Risk Score and Plan: 4 or greater and Ondansetron, Dexamethasone and Midazolam  Airway Management Planned: Oral ETT  Additional Equipment: None  Intra-op Plan:   Post-operative Plan:   Informed Consent: I have reviewed the patients History and Physical, chart, labs and discussed the procedure including the risks, benefits and alternatives for the proposed anesthesia with the patient or authorized representative who has indicated his/her understanding and acceptance.     Dental advisory given  Plan Discussed with: CRNA  Anesthesia Plan Comments:        Anesthesia Quick Evaluation

## 2020-12-09 NOTE — Anesthesia Procedure Notes (Signed)
Procedure Name: Intubation Date/Time: 12/09/2020 7:39 AM Performed by: Willa Frater, CRNA Pre-anesthesia Checklist: Patient identified, Emergency Drugs available, Suction available and Patient being monitored Patient Re-evaluated:Patient Re-evaluated prior to induction Oxygen Delivery Method: Circle system utilized Preoxygenation: Pre-oxygenation with 100% oxygen Induction Type: IV induction Ventilation: Mask ventilation without difficulty Laryngoscope Size: Mac and 3 Grade View: Grade I Tube type: Oral Number of attempts: 1 Airway Equipment and Method: Stylet and Oral airway Placement Confirmation: ETT inserted through vocal cords under direct vision, positive ETCO2 and breath sounds checked- equal and bilateral Secured at: 22 cm Tube secured with: Tape Dental Injury: Teeth and Oropharynx as per pre-operative assessment

## 2020-12-09 NOTE — Anesthesia Postprocedure Evaluation (Signed)
Anesthesia Post Note  Patient: Jenny Carroll  Procedure(s) Performed: BILATERAL BREAST REDUCTION WITH LIPOSUCTION (Bilateral: Breast)     Patient location during evaluation: Phase II Anesthesia Type: General Level of consciousness: awake and sedated Pain management: pain level controlled Vital Signs Assessment: post-procedure vital signs reviewed and stable Respiratory status: spontaneous breathing Cardiovascular status: stable Postop Assessment: no apparent nausea or vomiting Anesthetic complications: no   No notable events documented.  Last Vitals:  Vitals:   12/09/20 1200 12/09/20 1221  BP: 112/65 118/65  Pulse: 89 84  Resp: 10 16  Temp:  36.6 C  SpO2: 96% 97%    Last Pain:  Vitals:   12/09/20 1221  TempSrc: Oral  PainSc: 3                  John F Scharlene Corn

## 2020-12-09 NOTE — Op Note (Addendum)
Breast Reduction Op note:    DATE OF PROCEDURE: 12/09/2020  LOCATION: [facility]  SURGEON: [facility] Josuha Fontanez, DO  ASSISTANT: Evelena Leyden, PA  PREOPERATIVE DIAGNOSIS 1. Macromastia 2. Neck Pain 3. Back Pain  POSTOPERATIVE DIAGNOSIS 1. Macromastia 2. Neck Pain 3. Back Pain  PROCEDURES 1. Bilateral breast reduction.  Right reduction 1298 g, Left reduction 1461 g  COMPLICATIONS: None.  DRAINS: none  INDICATIONS FOR PROCEDURE Jenny Carroll is a 55 y.o. year-old female born on 10-31-1965,with a history of symptomatic macromastia with concominant back pain, neck pain, shoulder grooving from her bra.   MRN: [id number]  CONSENT Informed consent was obtained directly from the patient. The risks, benefits and alternatives were fully discussed. Specific risks including but not limited to bleeding, infection, hematoma, seroma, scarring, pain, nipple necrosis, asymmetry, poor cosmetic results, and need for further surgery were discussed. The patient had ample opportunity to have her questions answered to her satisfaction.  DESCRIPTION OF PROCEDURE  Patient was brought into the operating room and placed in a supine position.  SCDs were placed and appropriate padding was performed.  Antibiotics were given. The patient underwent general anesthesia and the chest was prepped and draped in a sterile fashion.  A timeout was performed and all information was confirmed to be correct. Tumescent was placed in each breast laterally.  Right side: Preoperative markings were confirmed.  Incision lines were injected with local with epinephrine.  Liposuction was done in the inferior and lateral portion of the breast. After waiting for vasoconstriction, the marked lines were incised.  A Wise-pattern superomedial breast reduction was performed by de-epithelializing the pedicle, using bovie to create the superomedial pedicle, and removing breast tissue from the lateral and inferior  portions of the breast.  Care was taken to not undermine the breast pedicle. Hemostasis was achieved.  The nipple was gently rotated into position and the soft tissue closed with 4-0 Monocryl.   The pocket was irrigated and hemostasis confirmed.  The deep tissues were approximated with 3-0 PDS and Monocryl sutures and the skin was closed with deep dermal and subcuticular 4-0 Monocryl sutures.  The nipple and skin flaps had good capillary refill at the end of the procedure.    Left side: Preoperative markings were confirmed.  Incision lines were injected with local with epinephrine.  Liposuction was done in the inferior and lateral portion of the breast. After waiting for vasoconstriction, the marked lines were incised.  A Wise-pattern superomedial breast reduction was performed by de-epithelializing the pedicle, using bovie to create the superomedial pedicle, and removing breast tissue from the lateral and inferior portions of the breast.  Care was taken to not undermine the breast pedicle. Hemostasis was achieved.  The nipple was gently rotated into position and the soft tissue was closed with 4-0 Monocryl.  The patient was sat upright and size and shape symmetry was confirmed.  The pocket was irrigated and hemostasis confirmed.  The deep tissues were approximated with 3-0 PDS and Monocryl sutures and the skin was closed with deep dermal and subcuticular 4-0 Monocryl sutures.  Dermabond was applied.  A breast binder and ABDs were placed.  The nipple and skin flaps had good capillary refill at the end of the procedure.  The patient tolerated the procedure well. The patient was allowed to wake from anesthesia and taken to the recovery room in satisfactory condition.  The advanced practice practitioner (APP) assisted throughout the case.  The APP was essential in retraction and counter  traction when needed to make the case progress smoothly.  This retraction and assistance made it possible to see the tissue plans  for the procedure.  The assistance was needed for blood control, tissue re-approximation and assisted with closure of the incision site.

## 2020-12-09 NOTE — Discharge Instructions (Addendum)
INSTRUCTIONS FOR AFTER SURGERY   You will likely have some questions about what to expect following your operation.  The following information will help you and your family understand what to expect when you are discharged from the hospital.  Following these guidelines will help ensure a smooth recovery and reduce risks of complications.  Postoperative instructions include information on: diet, wound care, medications and physical activity.  AFTER SURGERY Expect to go home after the procedure.  In some cases, you may need to spend one night in the hospital for observation.  DIET This surgery does not require a specific diet.  However, I have to mention that the healthier you eat the better your body can start healing. It is important to increasing your protein intake.  This means limiting the foods with added sugar.  Focus on fruits and vegetables and some meat. It is very important to drink water after your surgery.  If your urine is bright yellow, then it is concentrated, and you need to drink more water.  As a general rule after surgery, you should have 8 ounces of water every hour while awake.  If you find you are persistently nauseated or unable to take in liquids let us know.  NO TOBACCO USE or EXPOSURE.  This will slow your healing process and increase the risk of a wound.  WOUND CARE If you don't have a drain: You can shower the day after surgery.  Use fragrance free soap.  Dial, [place], [place] and Cetaphil are usually mild on the skin.  If you have steri-strips / tape directly attached to your skin leave them in place. It is OK to get these wet.  No baths, pools or hot tubs for two weeks. We close your incision to leave the smallest and best-looking scar. No ointment or creams on your incisions until given the go ahead.  Especially not Neosporin (Too many skin reactions with this one).  A few weeks after surgery you can use Mederma and start massaging the scar. We ask you to wear your binder or  sports bra for the first 6 weeks around the clock, including while sleeping. This provides added comfort and helps reduce the fluid accumulation at the surgery site.  ACTIVITY No heavy lifting until cleared by the doctor.  It is OK to walk and climb stairs. In fact, moving your legs is very important to decrease your risk of a blood clot.  It will also help keep you from getting deconditioned.  Every 1 to 2 hours get up and walk for 5 minutes. This will help with a quicker recovery back to normal.  Let pain be your guide so you don't do too much.  NO, you cannot do the spring cleaning and don't plan on taking care of anyone else.  This is your time for TLC.   WORK Everyone returns to work at different times. As a rough guide, most people take at least 1 - 2 weeks off prior to returning to work. If you need documentation for your job, bring the forms to your postoperative follow up visit.  DRIVING Arrange for someone to bring you home from the hospital.  You may be able to drive a few days after surgery but not while taking any narcotics or valium.  BOWEL MOVEMENTS Constipation can occur after anesthesia and while taking pain medication.  It is important to stay ahead for your comfort.  We recommend taking Milk of Magnesia (2 tablespoons; twice a day) while taking  the pain pills.  SEROMA This is fluid your body tried to put in the surgical site.  This is normal but if it creates excessive pain and swelling let us know.  It usually decreases in a few weeks.  MEDICATIONS and PAIN CONTROL At your preoperative visit for you history and physical you were given the following medications: An antibiotic: Start this medication when you get home and take according to the instructions on the bottle. Zofran 4 mg:  This is to treat nausea and vomiting.  You can take this every 6 hours as needed and only if needed. Norco (hydrocodone/acetaminophen) 5/325 mg:  This is only to be used after you have taken the  motrin or the tylenol. Every 8 hours as needed. Over the counter Medication to take: Ibuprofen (Motrin) 600 mg:  Take this every 6 hours.  If you have additional pain then take 500 mg of the tylenol.  Only take the Norco after you have tried these two. Miralax or stool softener of choice: Take this according to the bottle if you take the Norco.  WHEN TO CALL Call your surgeon's office if any of the following occur:  Fever 101 degrees F or greater  Excessive bleeding or fluid from the incision site.  Pain that increases over time without aid from the medications  Redness, warmth, or pus draining from incision sites  Persistent nausea or inability to take in liquids  Severe misshapen area that underwent the operation.  No Ibuprofen (or any NSAIDS) before 5pm tonight   Post Anesthesia Home Care Instructions  Activity: Get plenty of rest for the remainder of the day. A responsible individual must stay with you for 24 hours following the procedure.  For the next 24 hours, DO NOT: -Drive a car -Advertising copywriter -Drink alcoholic beverages -Take any medication unless instructed by your physician -Make any legal decisions or sign important papers.  Meals: Start with liquid foods such as gelatin or soup. Progress to regular foods as tolerated. Avoid greasy, spicy, heavy foods. If nausea and/or vomiting occur, drink only clear liquids until the nausea and/or vomiting subsides. Call your physician if vomiting continues.  Special Instructions/Symptoms: Your throat may feel dry or sore from the anesthesia or the breathing tube placed in your throat during surgery. If this causes discomfort, gargle with warm salt water. The discomfort should disappear within 24 hours.  If you had a scopolamine patch placed behind your ear for the management of post- operative nausea and/or vomiting:  1. The medication in the patch is effective for 72 hours, after which it should be removed.  Wrap patch in a  tissue and discard in the trash. Wash hands thoroughly with soap and water. 2. You may remove the patch earlier than 72 hours if you experience unpleasant side effects which may include dry mouth, dizziness or visual disturbances. 3. Avoid touching the patch. Wash your hands with soap and water after contact with the patch.

## 2020-12-09 NOTE — Transfer of Care (Signed)
Immediate Anesthesia Transfer of Care Note  Patient: Jenny Carroll  Procedure(s) Performed: BILATERAL BREAST REDUCTION WITH LIPOSUCTION (Bilateral: Breast)  Patient Location: PACU  Anesthesia Type:General  Level of Consciousness: awake, alert , oriented, drowsy and patient cooperative  Airway & Oxygen Therapy: Patient Spontanous Breathing and Patient connected to face mask oxygen  Post-op Assessment: Report given to RN and Post -op Vital signs reviewed and stable  Post vital signs: Reviewed and stable  Last Vitals:  Vitals Value Taken Time  BP    Temp    Pulse    Resp    SpO2      Last Pain:  Vitals:   12/09/20 0654  TempSrc: Oral  PainSc: 0-No pain      Patients Stated Pain Goal: 5 (12/09/20 0654)  Complications: No notable events documented.

## 2020-12-10 ENCOUNTER — Encounter (HOSPITAL_BASED_OUTPATIENT_CLINIC_OR_DEPARTMENT_OTHER): Payer: Self-pay | Admitting: Plastic Surgery

## 2020-12-10 LAB — SURGICAL PATHOLOGY

## 2020-12-11 ENCOUNTER — Telehealth: Payer: Self-pay

## 2020-12-11 NOTE — Telephone Encounter (Signed)
Patient's daughter called to say that the patient had breast reduction surgery on Wednesday with Dr. Ulice Bold, and now the top of her breast feels a little hard and she thought they said it shouldn't get hard.  Please call.

## 2020-12-11 NOTE — Telephone Encounter (Signed)
Patient called to say that she had surgery with Dr. Ulice Bold on Wednesday and she does not have a drainage bulb.  Patient would like to know if she needs to change her bandages.  If so, she doesn't have any bandages to replace them with.  Please call.

## 2020-12-11 NOTE — Telephone Encounter (Signed)
Called and spoke with the patient regarding the message below.  Patient stated that she just wanted to make sure of what she needed to put back on after taking a shower.  She stated that it does not say on the instructions that was given to her from our office.   Informed the patient that she can put gauze back on for comfort underneath the binder.  Also informed the patient that she will need to wear the binder 24/7.  Patient verbalized understanding and agreed.//AB/CMA

## 2020-12-14 NOTE — Telephone Encounter (Signed)
Called and spoke with the patient on (12/11/20) regarding the message below.    Informed the patient that it could be the swelling which could make her breast feel hard.  So she will need to make sure she's wearing her binder.   She also stated that she has a little area that is having some drainage.  Informed the patient that she can use vaseline and gauze on the area that is draining.  Informed the patient if she feels things are getting worse she can call the office number and speak with the doctor on call.    Patient verbalized understanding and agreed.//AB/CMA

## 2020-12-18 ENCOUNTER — Ambulatory Visit (INDEPENDENT_AMBULATORY_CARE_PROVIDER_SITE_OTHER): Payer: No Typology Code available for payment source | Admitting: Plastic Surgery

## 2020-12-18 ENCOUNTER — Encounter: Payer: Self-pay | Admitting: Plastic Surgery

## 2020-12-18 ENCOUNTER — Other Ambulatory Visit: Payer: Self-pay

## 2020-12-18 DIAGNOSIS — N62 Hypertrophy of breast: Secondary | ICD-10-CM

## 2020-12-18 NOTE — Progress Notes (Signed)
55 year old female here for follow-up on her bilateral breast reduction.  The honeycomb and Steri-Strips are still in place.  She is very pleased and doing well.  She has swelling and bruising as expected.  She has not been using any pain meds.  I encouraged her to use some Motrin to help with the swelling and make sure her bowels are still moving.  We will plan to see her back in 2 weeks.

## 2020-12-30 ENCOUNTER — Other Ambulatory Visit: Payer: Self-pay

## 2020-12-30 ENCOUNTER — Ambulatory Visit (INDEPENDENT_AMBULATORY_CARE_PROVIDER_SITE_OTHER): Payer: No Typology Code available for payment source | Admitting: Surgical

## 2020-12-30 DIAGNOSIS — G8929 Other chronic pain: Secondary | ICD-10-CM

## 2020-12-30 DIAGNOSIS — N62 Hypertrophy of breast: Secondary | ICD-10-CM

## 2020-12-30 DIAGNOSIS — M542 Cervicalgia: Secondary | ICD-10-CM

## 2020-12-30 DIAGNOSIS — M546 Pain in thoracic spine: Secondary | ICD-10-CM

## 2020-12-30 NOTE — Progress Notes (Signed)
Patient is a very pleasant 55 year old female here for follow-up after bilateral breast reduction with Dr. Ulice Bold on 12/09/2020.  She reports overall she is doing well, she feels as if she still has some swelling and feels a few areas of tenderness/firmness along the lateral breasts.  She is not having any infectious symptoms.  Chaperone present on exam On exam bilateral NAC's are viable, bilateral breast incisions appear intact.  She does have the Steri-Strips still in place.  There is no erythema or cellulitic changes.  No subcutaneous fluid collections noted with palpation.  There is a little bit of fat necrosis noted laterally of the right breast that is minimally tender with palpation.  Recommend following up in a few weeks for reevaluation.  We discussed that if the Steri-Strips begin to come off over the next few days that she can slowly remove them as she is showering.  We discussed restrictions.  All of her questions were answered to her content.  I do not see any signs of infection, seroma or hematoma.

## 2021-01-01 ENCOUNTER — Encounter: Payer: No Typology Code available for payment source | Admitting: Surgical

## 2021-01-01 ENCOUNTER — Telehealth: Payer: Self-pay

## 2021-01-01 NOTE — Telephone Encounter (Signed)
Patient called to say that she would like to speak with Dr. Ulice Bold.  She said that she went back to work this week and by Thursday evening, she could barely lift her left arm.  Patient said that she would like for Dr. Ulice Bold to write her out of work for another week, returning to work on 01/11/2021.  Please call.

## 2021-01-01 NOTE — Telephone Encounter (Signed)
Called and spoke with the patient regarding the message below.  Informed the patient that I spoke with Dr. Ulice Bold and she stated it would be fine for her to return back to work on 01/11/21.  Dr. Ulice Bold stated that she can been in [facility] paperwork and we will fill it out or we can write her a note stating she is to return to work on 01/11/2021.  Patient verbalized understanding and stated that she will check with her employer and see for she will need to have a note or FMLA filled out.//AB/CMA

## 2021-01-04 ENCOUNTER — Telehealth: Payer: Self-pay

## 2021-01-04 NOTE — Telephone Encounter (Signed)
Pt reports bleeding at incision site under L breast onset today around 4:45 pm when she laid down in bed. She reports no pain, n/v and post op swelling. Pt had breast reduction on 8/24 and reports steri strips are still in intact. Breast is warm but not hot.   I adv pt that bleeding is after SX is not abnl but she needs to be assessed. I also adv her to apply vaseline and gauze to the site. She stated she would have her dtr get her guaze because she didn't have any or maxi pads. I adv her to have her dtr guide her hand to place guaze where bleeding is coming from. Pt conveyed understanding. Offered an earlier appt and adv that FD would be in touch tomorrow to get her sch with Susy Frizzle or [place], [place] this week. Pt already has post op appt sch x 9/28 with [place], [place].

## 2021-01-04 NOTE — Telephone Encounter (Signed)
Patient called to say that she is bleeding from her left breast.  Patient said that she had breast reduction surgery about three weeks ago.  Patient said the blood is gushing and she has covered it with a paper towel.  Please call.

## 2021-01-05 ENCOUNTER — Ambulatory Visit (INDEPENDENT_AMBULATORY_CARE_PROVIDER_SITE_OTHER): Payer: No Typology Code available for payment source | Admitting: Physician Assistant

## 2021-01-05 ENCOUNTER — Other Ambulatory Visit: Payer: Self-pay

## 2021-01-05 ENCOUNTER — Other Ambulatory Visit (HOSPITAL_COMMUNITY): Payer: Self-pay

## 2021-01-05 ENCOUNTER — Encounter: Payer: Self-pay | Admitting: Physician Assistant

## 2021-01-05 VITALS — BP 143/91 | HR 72 | Temp 98.2°F

## 2021-01-05 DIAGNOSIS — Z9889 Other specified postprocedural states: Secondary | ICD-10-CM

## 2021-01-05 MED ORDER — CEPHALEXIN 500 MG PO CAPS
500.0000 mg | ORAL_CAPSULE | Freq: Three times a day (TID) | ORAL | 0 refills | Status: DC
  Filled 2021-01-05: qty 15, 5d supply, fill #0

## 2021-01-05 MED ORDER — CEPHALEXIN 500 MG PO CAPS
500.0000 mg | ORAL_CAPSULE | Freq: Three times a day (TID) | ORAL | 0 refills | Status: AC
  Filled 2021-01-05: qty 15, 5d supply, fill #0

## 2021-01-05 NOTE — Telephone Encounter (Signed)
Can see pt tomorrow, otherwise next week at next available appointment. Continue with vaseline and gauze

## 2021-01-05 NOTE — Progress Notes (Signed)
Patient is a 56 year old female s/p bilateral breast reduction performed 12/09/2020 by Dr. Ulice Bold.  I reviewed patient's medical record and she was seen most recently on 12/30/2020.  At that time, physical exam was reassuring.  She did endorse swelling and areas of tenderness/firmness along breasts laterally.  Incisions were intact, NAC is viable.  Steri-Strips remain in place.  There were no subcutaneous fluid collections noted with palpation.  On my examination today, patient is complaining of bleeding that began yesterday.  She states that she does not experience any new or worsening pain symptoms, but rather had swelling in the left breast and then started spontaneously oozing bloody discharge from inframammary fold.  She denies any fevers, worsening pain symptoms, significant swelling or asymmetry, or other symptoms.  She showed me pictures and videos from her cell phone of her drainage yesterday.  On my physical exam, breasts appear symmetric, NAC is viable.  Chaperone present.  Erythema and induration over left lateral breast.  There is also a small 1 cm wound at incision site in the left lateral inframammary incision.  There is no active bleeding or drainage from the site, but there is mild surrounding erythema and fullness.  Suspect that she spontaneously is draining a seroma.  Will recommend Vaseline and gauze to the wound site and prescribe a short course of Keflex given mild redness and induration near wound site.  She has an appointment already scheduled for next week, recommend that she call the clinic should she develop any new or worsening symptoms.  Any pictures obtained of the patient and placed in the chart were with the patient's or guardian's permission.

## 2021-01-05 NOTE — Telephone Encounter (Signed)
Patient came in the office today to be observed

## 2021-01-05 NOTE — Addendum Note (Signed)
Addended by: Evelena Leyden on: 01/05/2021 09:11 AM   Modules accepted: Orders

## 2021-01-08 ENCOUNTER — Encounter: Payer: No Typology Code available for payment source | Admitting: Physician Assistant

## 2021-01-13 ENCOUNTER — Other Ambulatory Visit: Payer: Self-pay

## 2021-01-13 ENCOUNTER — Ambulatory Visit (INDEPENDENT_AMBULATORY_CARE_PROVIDER_SITE_OTHER): Payer: No Typology Code available for payment source | Admitting: Surgical

## 2021-01-13 DIAGNOSIS — Z9889 Other specified postprocedural states: Secondary | ICD-10-CM

## 2021-01-13 DIAGNOSIS — N62 Hypertrophy of breast: Secondary | ICD-10-CM

## 2021-01-13 DIAGNOSIS — G8929 Other chronic pain: Secondary | ICD-10-CM

## 2021-01-13 DIAGNOSIS — M546 Pain in thoracic spine: Secondary | ICD-10-CM

## 2021-01-13 DIAGNOSIS — M542 Cervicalgia: Secondary | ICD-10-CM

## 2021-01-13 NOTE — Progress Notes (Signed)
Patient is a 54 year old female here for follow-up after bilateral breast reduction with Dr. Ulice Bold on 12/09/2020.  She is just over 1 month postop.  She is overall doing well, reports improvement in her symptoms after her last visit in the short course of antibiotics.  She is not having any infectious symptoms.  She reports the right breast tenderness and firmness has improved, but she is still having some left breast tenderness and firmness and is concerned about the shape.  Chaperone present on exam On exam bilateral NAC's are viable, bilateral breast incisions are intact.  She does have a pin point wound on the left breast just lateral to the vertical limb incision.  There is no surrounding erythema or cellulitic changes.  No purulence is noted with palpation.  In the left lateral breast she does have some firmness and some induration but no erythema is noted.   Recommend Vaseline and gauze to the left breast pinhole wound, recommend continue to wear compressive garment.  Recommend continue to avoid strenuous activities.  We discussed restrictions.  All of her questions were answered to her content.  Pictures were taken and placed in the patient's chart with patient's permission.  Recommend following up in 4 weeks for reevaluation.  Recommend calling with questions or concerns.

## 2021-02-03 ENCOUNTER — Other Ambulatory Visit (HOSPITAL_COMMUNITY): Payer: Self-pay

## 2021-02-03 MED ORDER — TRIAMCINOLONE ACETONIDE 0.1 % EX CREA
TOPICAL_CREAM | CUTANEOUS | 0 refills | Status: DC
  Filled 2021-02-03: qty 30, 10d supply, fill #0

## 2021-02-24 ENCOUNTER — Encounter: Payer: Self-pay | Admitting: Surgical

## 2021-02-24 ENCOUNTER — Other Ambulatory Visit: Payer: Self-pay

## 2021-02-24 ENCOUNTER — Ambulatory Visit (INDEPENDENT_AMBULATORY_CARE_PROVIDER_SITE_OTHER): Payer: No Typology Code available for payment source | Admitting: Surgical

## 2021-02-24 ENCOUNTER — Telehealth: Payer: Self-pay | Admitting: *Deleted

## 2021-02-24 DIAGNOSIS — Z9889 Other specified postprocedural states: Secondary | ICD-10-CM

## 2021-02-24 NOTE — Telephone Encounter (Signed)
Faxed order form,demographics,insurance card,and recent office visit to [facility] [place].  Confirmation received and copy scanned into the chart.//AB/CMA

## 2021-02-24 NOTE — Progress Notes (Signed)
Patient is a 55 year old female here for follow-up after bilateral breast reduction with Dr. Ulice Bold on 12/09/2020.  She is 11 weeks postop.  Patient reports overall she is doing well.  She reports noticing some redness around bilateral nipple/areola.  She has some concerns about the left lateral breast/axilla shape as well as an area of firmness on the left lateral breast.  Chaperone present on exam On exam bilateral NAC's are viable, bilateral breast incisions are intact.  No wounds are noted.  She has good shape overall, but does have some scarring over the left lateral breast that is causing some contour abnormalities within the axilla.  Possible small amount of fat necrosis noted in the left lateral breast as well.  Recommend massaging the left lateral breast to assist with reabsorption of the fat necrosis left lateral breast and disrupting the scar tissue in the left lateral breast.  We discussed [employer] for assistance with measuring breast size after her surgery.  She has not been fitted yet.  All of her questions were answered to her content.  We discussed scheduling additional follow-up in 3 months or following up as needed if her left lateral breast scarring does not improve with massage.  Pictures were taken and placed in the patient's chart with patient's permission.

## 2021-07-05 ENCOUNTER — Telehealth: Payer: Self-pay

## 2021-07-05 NOTE — Telephone Encounter (Signed)
Error

## 2021-07-13 ENCOUNTER — Ambulatory Visit (INDEPENDENT_AMBULATORY_CARE_PROVIDER_SITE_OTHER): Payer: No Typology Code available for payment source | Admitting: Plastic Surgery

## 2021-07-13 ENCOUNTER — Other Ambulatory Visit: Payer: Self-pay

## 2021-07-13 ENCOUNTER — Encounter: Payer: Self-pay | Admitting: Plastic Surgery

## 2021-07-13 DIAGNOSIS — N62 Hypertrophy of breast: Secondary | ICD-10-CM

## 2021-07-13 NOTE — Progress Notes (Signed)
? ?  Subjective:  ? ? Patient ID: Jenny Carroll, female    DOB: 06/15/1965, 56 y.o.   MRN: [id number] ? ?HPI ? ?The patient is a 56 year old female here for evaluation of her upper abdominal area.  She underwent bilateral breast reduction in August 2022.  She is very pleased with her results.  She has very nice symmetry.  She had over 1200 g removed from each breast.  This area she has noticed over the last couple of months is right in the midline at her xiphoid.  It is approximately 3 x 3 cm and feels like a lipoma.  She has no other signs or symptoms.  Her mammogram is due sometime this year. ? ?Review of Systems  ?Constitutional: Negative.   ?Eyes: Negative.   ?Respiratory: Negative.    ?Cardiovascular: Negative.   ?Gastrointestinal: Negative.   ?Endocrine: Negative.   ?Genitourinary: Negative.   ?Musculoskeletal: Negative.   ? ?   ?Objective:  ? Physical Exam ?HENT:  ?   Head: Normocephalic and atraumatic.  ?Cardiovascular:  ?   Rate and Rhythm: Normal rate.  ?   Pulses: Normal pulses.  ?Pulmonary:  ?   Effort: Pulmonary effort is normal.  ?Abdominal:  ?   General: There is no distension.  ?   Palpations: Abdomen is soft. There is mass.  ?   Tenderness: There is no abdominal tenderness. There is no guarding.  ?   Hernia: No hernia is present.  ?Musculoskeletal:     ?   General: No swelling or deformity.  ?Skin: ?   General: Skin is warm.  ?   Capillary Refill: Capillary refill takes less than 2 seconds.  ?   Coloration: Skin is not jaundiced.  ?   Findings: No bruising.  ?Neurological:  ?   Mental Status: She is alert and oriented to person, place, and time.  ?Psychiatric:     ?   Mood and Affect: Mood normal.     ?   Behavior: Behavior normal.     ?   Thought Content: Thought content normal.  ? ? ?   ?Assessment & Plan:  ? ?  ICD-10-CM   ?1. Symptomatic mammary hypertrophy  N62   ?  ?  ?The patient is planning on losing weight.  I recommend that she work on that and then lets reevaluate in 6 months.  However if it  gets any larger I want to see her back sooner.  We could always do an ultrasound for further evaluation. ? ?

## 2021-08-25 ENCOUNTER — Other Ambulatory Visit: Payer: Self-pay | Admitting: Family Medicine

## 2021-08-25 DIAGNOSIS — E041 Nontoxic single thyroid nodule: Secondary | ICD-10-CM

## 2021-09-10 ENCOUNTER — Ambulatory Visit
Admission: RE | Admit: 2021-09-10 | Discharge: 2021-09-10 | Disposition: A | Discharge status: Home / Self Care | Payer: No Typology Code available for payment source | Source: Ambulatory Visit | Attending: Family Medicine | Admitting: Family Medicine

## 2021-09-10 DIAGNOSIS — E041 Nontoxic single thyroid nodule: Secondary | ICD-10-CM

## 2021-11-08 ENCOUNTER — Encounter: Payer: Self-pay | Admitting: Gastroenterology

## 2021-11-19 ENCOUNTER — Ambulatory Visit (INDEPENDENT_AMBULATORY_CARE_PROVIDER_SITE_OTHER): Payer: No Typology Code available for payment source | Admitting: Physician Assistant

## 2021-11-19 ENCOUNTER — Encounter: Payer: Self-pay | Admitting: Physician Assistant

## 2021-11-19 ENCOUNTER — Other Ambulatory Visit (HOSPITAL_COMMUNITY): Payer: Self-pay

## 2021-11-19 VITALS — BP 142/76 | HR 88 | Ht 66.0 in | Wt 227.0 lb

## 2021-11-19 DIAGNOSIS — D509 Iron deficiency anemia, unspecified: Secondary | ICD-10-CM

## 2021-11-19 MED ORDER — NA SULFATE-K SULFATE-MG SULF 17.5-3.13-1.6 GM/177ML PO SOLN
1.0000 | Freq: Once | ORAL | 0 refills | Status: AC
  Filled 2021-11-19: qty 354, 1d supply, fill #0

## 2021-11-19 NOTE — Progress Notes (Signed)
Chief Complaint: IDA and Hemoccult positive stools.  HPI:    Jenny Carroll is a 56 year old female with a past medical history as listed below including anemia, known to Dr. Lavon Paganini, who was referred to me by Laurann Montana, MD for a complaint of iron deficiency anemia and Hemoccult positive stools.      03/17/2015 colonoscopy for screening was normal.  Repeat recommended in 10 years.    09/10/2021 iron studies with a percent saturation minimally decreased at 19% and otherwise normal CBC with hemoglobin of 11.4 and otherwise normal.    09/22/2021 guaiac stool cards positive x1. 10/18/2021 guaiac stool cards positive x3.    Today, the patient tells me that she was told she was iron deficient and needed to do stool studies.  These came back positive and she was told she needed to see [place].  Tells me she has a history of iron deficiency anemia "years and years ago", for which she saw specialist who thought it was her menorrhagia.  She stopped having periods and everything had been normal until just recently.  Denies seeing any bright red blood or black tarry stools.  She has no GI complaints at all today and tells me she feels fine.    Denies fever, chills, weight loss or change in bowel habits.  Past Medical History:  Diagnosis Date   Anemia    history of anemai, better since no periods anymore   Blood transfusion without reported diagnosis 04/18/1992    Past Surgical History:  Procedure Laterality Date   BREAST REDUCTION SURGERY Bilateral 12/09/2020   Procedure: BILATERAL BREAST REDUCTION WITH LIPOSUCTION;  Surgeon: Peggye Form, DO;  Location: [facility];  Service: Plastics;  Laterality: Bilateral;  3 hours   CESAREAN SECTION     x3    Current Outpatient Medications  Medication Sig Dispense Refill   albuterol (VENTOLIN HFA) 108 (90 Base) MCG/ACT inhaler Inhale 2 puffs into the lungs every 4 hours as needed for shortness of breath/wheezing 18 g 0   budesonide-formoterol  (SYMBICORT) 80-4.5 MCG/ACT inhaler Inhale 2 puffs into the lungs 2 (two) times daily. 10.2 g 2   cholecalciferol (VITAMIN D) 1000 UNITS tablet Take 1,000 Units by mouth daily.     triamcinolone cream (KENALOG) 0.1 % Apply to affected area twice a day as needed 30 g 0   No current facility-administered medications for this visit.    Allergies as of 11/19/2021   (No Known Allergies)    Family History  Problem Relation Age of Onset   Heart disease Maternal Uncle    Diabetes Maternal Grandmother    Colon cancer Neg Hx    Rectal cancer Neg Hx    Stomach cancer Neg Hx    Esophageal cancer Neg Hx     Social History   Socioeconomic History   Marital status: Single    Spouse name: Not on file   Number of children: Not on file   Years of education: Not on file   Highest education level: Not on file  Occupational History   Not on file  Tobacco Use   Smoking status: Never   Smokeless tobacco: Never  Vaping Use   Vaping Use: Never used  Substance and Sexual Activity   Alcohol use: No    Alcohol/week: 0.0 standard drinks of alcohol   Drug use: No   Sexual activity: Yes  Other Topics Concern   Not on file  Social History Narrative   Not on file  Social Determinants of Health   Financial Resource Strain: Not on file  Food Insecurity: Not on file  Transportation Needs: Not on file  Physical Activity: Not on file  Stress: Not on file  Social Connections: Not on file  Intimate Partner Violence: Not on file    Review of Systems:    Constitutional: No weight loss, fever or chills Cardiovascular: No chest pain Respiratory: No SOB  Gastrointestinal: See HPI and otherwise negative Genitourinary: No dysuria  Neurological: No headache, dizziness or syncope Musculoskeletal: No new muscle or joint pain Hematologic: No bruising Psychiatric: No history of depression or anxiety   Physical Exam:  Vital signs: BP (!) 142/76   Pulse 88   Ht 5\' 6"  (1.676 m)   Wt 227 lb (103 kg)    BMI 36.64 kg/m    Constitutional:   Pleasant AA female appears to be in NAD, Well developed, Well nourished, alert and cooperative Head:  Normocephalic and atraumatic. Eyes:   PEERL, EOMI. No icterus. Conjunctiva pink. Ears:  Normal auditory acuity. Neck:  Supple Throat: Oral cavity and pharynx without inflammation, swelling or lesion.  Respiratory: Respirations even and unlabored. Lungs clear to auscultation bilaterally.   No wheezes, crackles, or rhonchi.  Cardiovascular: Normal S1, S2. No MRG. Regular rate and rhythm. No peripheral edema, cyanosis or pallor.  Gastrointestinal:  Soft, nondistended, nontender. No rebound or guarding. Normal bowel sounds. No appreciable masses or hepatomegaly. Rectal:  Not performed.  Msk:  Symmetrical without gross deformities. Without edema, no deformity or joint abnormality.  Neurologic:  Alert and  oriented x4;  grossly normal neurologically.  Skin:   Dry and intact without significant lesions or rashes. Psychiatric:  Demonstrates good judgement and reason without abnormal affect or behaviors.  See HPI for recent labs.  Assessment: 1.  Iron deficiency anemia: Last colonoscopy in 2016 completely normal, now slightly iron deficient with a decreased hemoglobin and positive Hemoccult studies; consider GI source of blood loss versus other  Plan: 1.  Scheduled patient for an EGD and colonoscopy in the [facility] with Dr. 2017.  Did provide the patient a detailed list of risks for the procedures and she agrees to proceed. Patient is appropriate for endoscopic procedure(s) in the ambulatory ([facility]) setting.  2.  Patient to follow in clinic per recommendations from Dr. Lavon Paganini after time of procedures.  Lavon Paganini, PA-C [facility] 11/19/2021, 2:42 PM  Cc: 01/19/2022, MD

## 2021-11-19 NOTE — Patient Instructions (Signed)
You have been scheduled for an endoscopy and colonoscopy. Please follow the written instructions given to you at your visit today. Please pick up your prep supplies at the pharmacy within the next 1-3 days. If you use inhalers (even only as needed), please bring them with you on the day of your procedure.  The [facility] providers would like to encourage you to use MYCHART to communicate with providers for non-urgent requests or questions.  Due to long hold times on the telephone, sending your provider a message by MYCHART may be a faster and more efficient way to get a response.  Please allow 48 business hours for a response.  Please remember that this is for non-urgent requests.   Due to recent changes in healthcare laws, you may see the results of your imaging and laboratory studies on MyChart before your provider has had a chance to review them.  We understand that in some cases there may be results that are confusing or concerning to you. Not all laboratory results come back in the same time frame and the provider may be waiting for multiple results in order to interpret others.  Please give us 48 hours in order for your provider to thoroughly review all the results before contacting the office for clarification of your results.    

## 2021-11-26 ENCOUNTER — Other Ambulatory Visit (HOSPITAL_COMMUNITY): Payer: Self-pay

## 2021-12-21 ENCOUNTER — Encounter: Payer: Self-pay | Admitting: Gastroenterology

## 2021-12-28 ENCOUNTER — Ambulatory Visit (AMBULATORY_SURGERY_CENTER): Payer: No Typology Code available for payment source | Admitting: Gastroenterology

## 2021-12-28 ENCOUNTER — Encounter: Payer: Self-pay | Admitting: Gastroenterology

## 2021-12-28 VITALS — BP 138/82 | HR 76 | Temp 98.4°F | Resp 18 | Ht 66.0 in | Wt 227.0 lb

## 2021-12-28 DIAGNOSIS — B9681 Helicobacter pylori [H. pylori] as the cause of diseases classified elsewhere: Secondary | ICD-10-CM | POA: Diagnosis not present

## 2021-12-28 DIAGNOSIS — K2970 Gastritis, unspecified, without bleeding: Secondary | ICD-10-CM

## 2021-12-28 DIAGNOSIS — K649 Unspecified hemorrhoids: Secondary | ICD-10-CM

## 2021-12-28 DIAGNOSIS — D509 Iron deficiency anemia, unspecified: Secondary | ICD-10-CM

## 2021-12-28 DIAGNOSIS — K2950 Unspecified chronic gastritis without bleeding: Secondary | ICD-10-CM | POA: Diagnosis not present

## 2021-12-28 MED ORDER — SODIUM CHLORIDE 0.9 % IV SOLN: 500.0000 mL | Freq: Once | INTRAVENOUS | Status: DC

## 2021-12-28 NOTE — Progress Notes (Signed)
Called to room to assist during endoscopic procedure.  Patient ID and intended procedure confirmed with present staff. Received instructions for my participation in the procedure from the performing physician.  

## 2021-12-28 NOTE — Progress Notes (Signed)
[facility] History and Physical   Primary Care Physician:  Laurann Montana, MD   Reason for Procedure:  Iron deficiency  anemia  Plan:    EGD and colonoscopy with possible interventions as needed     HPI: Jenny Carroll is a very pleasant 56 y.o. female here for EGD and colonoscopy for iron deficiency anemia.   The risks and benefits as well as alternatives of endoscopic procedure(s) have been discussed and reviewed. All questions answered. The patient agrees to proceed.    Past Medical History:  Diagnosis Date   Anemia    history of anemai, better since no periods anymore   Blood transfusion without reported diagnosis 04/18/1992    Past Surgical History:  Procedure Laterality Date   BREAST REDUCTION SURGERY Bilateral 12/09/2020   Procedure: BILATERAL BREAST REDUCTION WITH LIPOSUCTION;  Surgeon: Peggye Form, DO;  Location: [facility];  Service: Plastics;  Laterality: Bilateral;  3 hours   CESAREAN SECTION     x3    Prior to Admission medications   Medication Sig Start Date End Date Taking? Authorizing Provider  cholecalciferol (VITAMIN D) 1000 UNITS tablet Take 1,000 Units by mouth daily.    [provider]  triamcinolone cream (KENALOG) 0.1 % Apply to affected area twice a day as needed 02/03/21       Current Outpatient Medications  Medication Sig Dispense Refill   cholecalciferol (VITAMIN D) 1000 UNITS tablet Take 1,000 Units by mouth daily.     triamcinolone cream (KENALOG) 0.1 % Apply to affected area twice a day as needed 30 g 0   Current Facility-Administered Medications  Medication Dose Route Frequency Provider Last Rate Last Admin   0.9 %  sodium chloride infusion  500 mL Intravenous Once Napoleon Form, MD        Allergies as of 12/28/2021   (No Known Allergies)    Family History  Problem Relation Age of Onset   Heart disease Maternal Uncle    Diabetes Maternal Grandmother    Colon cancer Neg Hx     Rectal cancer Neg Hx    Stomach cancer Neg Hx    Esophageal cancer Neg Hx     Social History   Socioeconomic History   Marital status: Single    Spouse name: Not on file   Number of children: Not on file   Years of education: Not on file   Highest education level: Not on file  Occupational History   Not on file  Tobacco Use   Smoking status: Never   Smokeless tobacco: Never  Vaping Use   Vaping Use: Never used  Substance and Sexual Activity   Alcohol use: No    Alcohol/week: 0.0 standard drinks of alcohol   Drug use: No   Sexual activity: Yes  Other Topics Concern   Not on file  Social History Narrative   Not on file   Social Determinants of Health   Financial Resource Strain: Not on file  Food Insecurity: Not on file  Transportation Needs: Not on file  Physical Activity: Not on file  Stress: Not on file  Social Connections: Not on file  Intimate Partner Violence: Not on file    Review of Systems:  All other review of systems negative except as mentioned in the HPI.  Physical Exam: Vital signs in last 24 hours: BP 126/74   Pulse 86   Temp 98.4 F (36.9 C)   Ht 5\' 6"  (1.676 m)   Wt  227 lb (103 kg)   SpO2 98%   BMI 36.64 kg/m  General:   Alert, NAD Lungs:  Clear .   Heart:  Regular rate and rhythm Abdomen:  Soft, nontender and nondistended. Neuro/Psych:  Alert and cooperative. Normal mood and affect. A and O x 3  Reviewed labs, radiology imaging, old records and pertinent past GI work up  Patient is appropriate for planned procedure(s) and anesthesia in an ambulatory setting   K. Scherry Ran , MD [phone]

## 2021-12-28 NOTE — Progress Notes (Signed)
Pt in recovery with monitors in place, VSS. Report given to receiving RN. Bite guard was placed with pt awake to ensure comfort. No dental or soft tissue damage noted. 

## 2021-12-28 NOTE — Patient Instructions (Signed)
Read all of the handouts given to you by your recovery room nurse.  YOU HAD AN ENDOSCOPIC PROCEDURE TODAY AT THE [facility]:   Refer to the procedure report that was given to you for any specific questions about what was found during the examination.  If the procedure report does not answer your questions, please call your gastroenterologist to clarify.  If you requested that your care partner not be given the details of your procedure findings, then the procedure report has been included in a sealed envelope for you to review at your convenience later.  YOU SHOULD EXPECT: Some feelings of bloating in the abdomen. Passage of more gas than usual.  Walking can help get rid of the air that was put into your GI tract during the procedure and reduce the bloating. If you had a lower endoscopy (such as a colonoscopy or flexible sigmoidoscopy) you may notice spotting of blood in your stool or on the toilet paper. If you underwent a bowel prep for your procedure, you may not have a normal bowel movement for a few days.  Please Note:  You might notice some irritation and congestion in your nose or some drainage.  This is from the oxygen used during your procedure.  There is no need for concern and it should clear up in a day or so.  SYMPTOMS TO REPORT IMMEDIATELY:  Following lower endoscopy (colonoscopy or flexible sigmoidoscopy):  Excessive amounts of blood in the stool  Significant tenderness or worsening of abdominal pains  Swelling of the abdomen that is new, acute  Fever of 100F or higher  Following upper endoscopy (EGD)  Vomiting of blood or coffee ground material  New chest pain or pain under the shoulder blades  Painful or persistently difficult swallowing  New shortness of breath  Fever of 100F or higher  Black, tarry-looking stools  For urgent or emergent issues, a gastroenterologist can be reached at any hour by calling [phone]. Do not use MyChart messaging for urgent  concerns.    DIET:  We do recommend a small meal at first, but then you may proceed to your regular diet.  Drink plenty of fluids but you should avoid alcoholic beverages for 24 hours.  ACTIVITY:  You should plan to take it easy for the rest of today and you should NOT DRIVE or use heavy machinery until tomorrow (because of the sedation medicines used during the test).    FOLLOW UP: Our staff will call the number listed on your records the next business day following your procedure.  We will call around 7:15- 8:00 am to check on you and address any questions or concerns that you may have regarding the information given to you following your procedure. If we do not reach you, we will leave a message.     If any biopsies were taken you will be contacted by phone or by letter within the next 1-3 weeks.  Please call us at [phone] if you have not heard about the biopsies in 3 weeks.    SIGNATURES/CONFIDENTIALITY: You and/or your care partner have signed paperwork which will be entered into your electronic medical record.  These signatures attest to the fact that that the information above on your After Visit Summary has been reviewed and is understood.  Full responsibility of the confidentiality of this discharge information lies with you and/or your care-partner.

## 2021-12-28 NOTE — Progress Notes (Signed)
Pt's states no medical or surgical changes since previsit or office visit. 

## 2021-12-28 NOTE — Op Note (Signed)
[facility] Patient Name: Jenny Carroll Procedure Date: 12/28/2021 12:34 PM MRN: [id number] Endoscopist: Napoleon Form , MD Age: 56 Referring MD:  Date of Birth: 11-15-1965 Gender: Female Account #: [id number] Procedure:                Colonoscopy Indications:              Unexplained iron deficiency anemia Medicines:                Monitored Anesthesia Care Procedure:                Pre-Anesthesia Assessment:                           - Prior to the procedure, a History and Physical                            was performed, and patient medications and                            allergies were reviewed. The patient's tolerance of                            previous anesthesia was also reviewed. The risks                            and benefits of the procedure and the sedation                            options and risks were discussed with the patient.                            All questions were answered, and informed consent                            was obtained. Prior Anticoagulants: The patient has                            taken no previous anticoagulant or antiplatelet                            agents. ASA Grade Assessment: II - A patient with                            mild systemic disease. After reviewing the risks                            and benefits, the patient was deemed in                            satisfactory condition to undergo the procedure.                           After obtaining informed consent, the colonoscope  was passed under direct vision. Throughout the                            procedure, the patient's blood pressure, pulse, and                            oxygen saturations were monitored continuously. The                            PCF-HQ190L Colonoscope was introduced through the                            anus and advanced to the the cecum, identified by                            appendiceal orifice  and ileocecal valve. The                            colonoscopy was performed without difficulty. The                            patient tolerated the procedure well. The quality                            of the bowel preparation was excellent. The                            ileocecal valve, appendiceal orifice, and rectum                            were photographed. Scope In: 2:00:36 PM Scope Out: 2:10:42 PM Scope Withdrawal Time: 0 hours 7 minutes 51 seconds  Total Procedure Duration: 0 hours 10 minutes 6 seconds  Findings:                 The perianal and digital rectal examinations were                            normal.                           Non-bleeding external and internal hemorrhoids were                            found during retroflexion. The hemorrhoids were                            small.                           The exam was otherwise without abnormality. Complications:            No immediate complications. Estimated Blood Loss:     Estimated blood loss was minimal. Impression:               - Non-bleeding external and internal hemorrhoids.                           -  The examination was otherwise normal.                           - No specimens collected. Recommendation:           - Patient has a contact number available for                            emergencies. The signs and symptoms of potential                            delayed complications were discussed with the                            patient. Return to normal activities tomorrow.                            Written discharge instructions were provided to the                            patient.                           - Resume previous diet.                           - Continue present medications.                           - Repeat colonoscopy in 10 years for screening                            purposes.                           - To visualize the small bowel, perform video                             capsule endoscopy.                           - Return to GI clinic at the next available                            appointment. Napoleon Form, MD 12/28/2021 2:17:45 PM This report has been signed electronically.

## 2021-12-28 NOTE — Op Note (Signed)
[facility] Patient Name: Ryden Kruszka Procedure Date: 12/28/2021 12:34 PM MRN: [id number] Endoscopist: Mauri Pole , MD Age: 56 Referring MD:  Date of Birth: [birth year] Gender: Female Account #: [id number] Procedure:                Upper GI endoscopy Indications:              Suspected upper gastrointestinal bleeding in                            patient with unexplained iron deficiency anemia Medicines:                Monitored Anesthesia Care Procedure:                Pre-Anesthesia Assessment:                           - Prior to the procedure, a History and Physical                            was performed, and patient medications and                            allergies were reviewed. The patient's tolerance of                            previous anesthesia was also reviewed. The risks                            and benefits of the procedure and the sedation                            options and risks were discussed with the patient.                            All questions were answered, and informed consent                            was obtained. Prior Anticoagulants: The patient has                            taken no previous anticoagulant or antiplatelet                            agents. ASA Grade Assessment: II - A patient with                            mild systemic disease. After reviewing the risks                            and benefits, the patient was deemed in                            satisfactory condition to undergo the procedure.  After obtaining informed consent, the endoscope was                            passed under direct vision. Throughout the                            procedure, the patient's blood pressure, pulse, and                            oxygen saturations were monitored continuously. The                            Endoscope was introduced through the mouth, and                            advanced  to the second part of duodenum. The upper                            GI endoscopy was accomplished without difficulty.                            The patient tolerated the procedure well. Scope In: Scope Out: Findings:                 The Z-line was regular and was found 35 cm from the                            incisors.                           No gross lesions were noted in the entire esophagus.                           Patchy mild inflammation characterized by                            congestion (edema) and erythema was found in the                            entire examined stomach. Biopsies were taken with a                            cold forceps for Helicobacter pylori testing.                           The cardia and gastric fundus were normal on                            retroflexion.                           The examined duodenum was normal. Complications:            No immediate complications. Estimated Blood Loss:     Estimated blood loss was minimal. Impression:               -  Z-line regular, 35 cm from the incisors.                           - No gross lesions in esophagus.                           - Gastritis. Biopsied.                           - Normal examined duodenum. Recommendation:           - Patient has a contact number available for                            emergencies. The signs and symptoms of potential                            delayed complications were discussed with the                            patient. Return to normal activities tomorrow.                            Written discharge instructions were provided to the                            patient.                           - Resume previous diet.                           - Continue present medications.                           - Await pathology results.                           - See the other procedure note for documentation of                            additional  recommendations. Napoleon Form, MD 12/28/2021 2:22:51 PM This report has been signed electronically.

## 2021-12-29 ENCOUNTER — Telehealth: Payer: Self-pay

## 2021-12-29 NOTE — Telephone Encounter (Signed)
  Follow up Call-     12/28/2021   12:57 PM  Call back number  Post procedure Call Back phone  # [phone]  Permission to leave phone message Yes     Patient questions:  Do you have a fever, pain , or abdominal swelling? No. Pain Score  0 *  Have you tolerated food without any problems? Yes.    Have you been able to return to your normal activities? Yes.    Do you have any questions about your discharge instructions: Diet   No. Medications  No. Follow up visit  No.  Do you have questions or concerns about your Care? Yes.    Patient is doing well, however, she stated that she has a swollen area on her lip, states there is NO break in the skin, just swollen. Advised patient that could possibly be from the mouth guard used during the procedure, to use ice to her lip for comfort and to help reduce the swelling, and that it should resolve on it's own. Call us if it does not. Patient verbalizes understanding and states she will do so.  Actions: * If pain score is 4 or above: No action needed, pain <4.

## 2021-12-31 ENCOUNTER — Other Ambulatory Visit: Payer: Self-pay

## 2021-12-31 ENCOUNTER — Other Ambulatory Visit (HOSPITAL_COMMUNITY): Payer: Self-pay

## 2021-12-31 ENCOUNTER — Encounter: Payer: Self-pay | Admitting: Gastroenterology

## 2021-12-31 DIAGNOSIS — D509 Iron deficiency anemia, unspecified: Secondary | ICD-10-CM

## 2021-12-31 DIAGNOSIS — K2970 Gastritis, unspecified, without bleeding: Secondary | ICD-10-CM

## 2021-12-31 MED ORDER — BISMUTH/METRONIDAZ/TETRACYCLIN 140-125-125 MG PO CAPS
3.0000 | ORAL_CAPSULE | Freq: Three times a day (TID) | ORAL | 0 refills | Status: DC
  Filled 2021-12-31: qty 120, 30d supply, fill #0

## 2022-01-01 ENCOUNTER — Other Ambulatory Visit (HOSPITAL_COMMUNITY): Payer: Self-pay

## 2022-01-06 ENCOUNTER — Telehealth: Payer: Self-pay

## 2022-01-06 ENCOUNTER — Other Ambulatory Visit: Payer: Self-pay

## 2022-01-06 ENCOUNTER — Other Ambulatory Visit (HOSPITAL_COMMUNITY): Payer: Self-pay

## 2022-01-06 MED ORDER — OMEPRAZOLE 40 MG PO CPDR
40.0000 mg | DELAYED_RELEASE_CAPSULE | Freq: Two times a day (BID) | ORAL | 0 refills | Status: DC
  Filled 2022-01-06: qty 20, 10d supply, fill #0

## 2022-01-06 NOTE — Telephone Encounter (Signed)
Patient will be treated with Pylera and Omeprazole 40 mg BID for H Pylori. She is scheduled for her capsule endoscopy 01/20/22. She has a follow up appointment 03/01/22. The patient wants to know if she should start the treatment for H Pylori before or after the capsule endoscopy.

## 2022-01-06 NOTE — Telephone Encounter (Signed)
Either is fine, if she wants to hold off starting treatment until after capsule endoscopy that is okay.  Thanks

## 2022-01-06 NOTE — Telephone Encounter (Signed)
Patient is advised.  

## 2022-01-20 ENCOUNTER — Other Ambulatory Visit: Payer: Self-pay

## 2022-01-24 ENCOUNTER — Ambulatory Visit (INDEPENDENT_AMBULATORY_CARE_PROVIDER_SITE_OTHER): Payer: No Typology Code available for payment source | Admitting: Gastroenterology

## 2022-01-24 DIAGNOSIS — D500 Iron deficiency anemia secondary to blood loss (chronic): Secondary | ICD-10-CM

## 2022-01-24 NOTE — Patient Instructions (Signed)

## 2022-01-24 NOTE — Progress Notes (Signed)
SN: [id number] Exp: 2023-04-23 LOT: [id number] Patient arrived for Capsule Endoscopy. Reported the prep went well. This nurse explained dietary restrictions for the next few hours. Patient verbalized understanding. Opened capsule, ensured capsule was flashing prior to the patient swallowing the capsule. Patient swallowed capsule without difficulty. Patient instructed to return to the office at 4:00 pm today for removal of the recording equipment, to call the office with any questions and if no capsule was visualized after 72 hours. No further questions by the conclusion of the visit.

## 2022-02-01 ENCOUNTER — Encounter: Payer: Self-pay | Admitting: Plastic Surgery

## 2022-02-01 ENCOUNTER — Ambulatory Visit (INDEPENDENT_AMBULATORY_CARE_PROVIDER_SITE_OTHER): Payer: No Typology Code available for payment source | Admitting: Plastic Surgery

## 2022-02-01 DIAGNOSIS — R222 Localized swelling, mass and lump, trunk: Secondary | ICD-10-CM

## 2022-02-01 DIAGNOSIS — R1900 Intra-abdominal and pelvic swelling, mass and lump, unspecified site: Secondary | ICD-10-CM | POA: Insufficient documentation

## 2022-02-01 NOTE — Progress Notes (Signed)
   Subjective:    Patient ID: Jenny Carroll, female    DOB: May 03, 1965, 56 y.o.   MRN: [id number]  The patient is a 56 year old female here for evaluation of a mass on her chest.  She had a breast reduction in August 2022. She has done well from that and is very happy.  She noticed a mass in the upper part of the abdomen between her breasts.  Seems to have gotten a little bit bigger.  I cannot tell if it is a lipoma or a sebaceous cyst.  There is no sign of infection or redness.  It is visible and getting larger.  It is about 3 cm in size, firm and not movable.      Review of Systems  Constitutional: Negative.   Eyes: Negative.   Respiratory: Negative.    Cardiovascular: Negative.   Gastrointestinal: Negative.   Endocrine: Negative.   Genitourinary: Negative.   Musculoskeletal: Negative.        Objective:   Physical Exam Constitutional:      Appearance: Normal appearance.  HENT:     Head: Normocephalic.  Cardiovascular:     Rate and Rhythm: Normal rate.     Pulses: Normal pulses.  Pulmonary:     Effort: Pulmonary effort is normal.  Abdominal:     Palpations: Abdomen is soft.  Skin:    General: Skin is warm.     Capillary Refill: Capillary refill takes less than 2 seconds.     Coloration: Skin is not jaundiced.     Findings: No bruising.  Neurological:     Mental Status: She is alert.  Psychiatric:        Mood and Affect: Mood normal.        Behavior: Behavior normal.        Thought Content: Thought content normal.        Judgment: Judgment normal.        Assessment & Plan:     ICD-10-CM   1. Abdominal mass, unspecified abdominal location  R19.00        Recommend excision of mass with path evaluation.  Pictures were obtained of the patient and placed in the chart with the patient's or guardian's permission.

## 2022-02-02 ENCOUNTER — Encounter: Payer: Self-pay | Admitting: Gastroenterology

## 2022-03-01 ENCOUNTER — Other Ambulatory Visit (INDEPENDENT_AMBULATORY_CARE_PROVIDER_SITE_OTHER): Payer: No Typology Code available for payment source

## 2022-03-01 ENCOUNTER — Ambulatory Visit (INDEPENDENT_AMBULATORY_CARE_PROVIDER_SITE_OTHER): Payer: No Typology Code available for payment source | Admitting: Gastroenterology

## 2022-03-01 ENCOUNTER — Encounter: Payer: Self-pay | Admitting: Gastroenterology

## 2022-03-01 VITALS — BP 120/72 | HR 68 | Wt 229.0 lb

## 2022-03-01 DIAGNOSIS — K2970 Gastritis, unspecified, without bleeding: Secondary | ICD-10-CM

## 2022-03-01 DIAGNOSIS — D509 Iron deficiency anemia, unspecified: Secondary | ICD-10-CM

## 2022-03-01 DIAGNOSIS — B9681 Helicobacter pylori [H. pylori] as the cause of diseases classified elsewhere: Secondary | ICD-10-CM

## 2022-03-01 DIAGNOSIS — K5904 Chronic idiopathic constipation: Secondary | ICD-10-CM

## 2022-03-01 LAB — CBC WITH DIFFERENTIAL/PLATELET
Basophils Absolute: 0 10*3/uL (ref 0.0–0.1)
Basophils Relative: 0.5 % (ref 0.0–3.0)
Eosinophils Absolute: 0.1 10*3/uL (ref 0.0–0.7)
Eosinophils Relative: 1.3 % (ref 0.0–5.0)
HCT: 37.1 % (ref 36.0–46.0)
Hemoglobin: 11.6 g/dL — ABNORMAL LOW (ref 12.0–15.0)
Lymphocytes Relative: 25.4 % (ref 12.0–46.0)
Lymphs Abs: 1.5 10*3/uL (ref 0.7–4.0)
MCHC: 31.3 g/dL (ref 30.0–36.0)
MCV: 69.1 fl — ABNORMAL LOW (ref 78.0–100.0)
Monocytes Absolute: 0.3 10*3/uL (ref 0.1–1.0)
Monocytes Relative: 4.6 % (ref 3.0–12.0)
Neutro Abs: 4.1 10*3/uL (ref 1.4–7.7)
Neutrophils Relative %: 68.2 % (ref 43.0–77.0)
Platelets: 263 10*3/uL (ref 150.0–400.0)
RBC: 5.37 Mil/uL — ABNORMAL HIGH (ref 3.87–5.11)
RDW: 16.6 % — ABNORMAL HIGH (ref 11.5–15.5)
WBC: 6 10*3/uL (ref 4.0–10.5)

## 2022-03-01 LAB — FOLATE: Folate: 12.3 ng/mL (ref >5.9)

## 2022-03-01 LAB — IBC + FERRITIN
Ferritin: 30.3 ng/mL (ref 10.0–291.0)
Iron: 57 ug/dL (ref 42–145)
Saturation Ratios: 19.2 % — ABNORMAL LOW (ref 20.0–50.0)
TIBC: 296.8 ug/dL (ref 250.0–450.0)
Transferrin: 212 mg/dL (ref 212.0–360.0)

## 2022-03-01 LAB — VITAMIN B12: Vitamin B-12: 120 pg/mL — ABNORMAL LOW (ref 211–911)

## 2022-03-01 NOTE — Patient Instructions (Signed)
Your provider has requested that you go to the basement level for lab work before leaving today. Press "B" on the elevator. The lab is located at the first door on the left as you exit the elevator.  A high fiber diet with plenty of fluids (up to 8 glasses of water daily) is suggested to relieve these symptoms.  Benefiber 1 tablespoon twice daily can be used to keep bowels regular if needed.  Increase your water intake 8-10 cups daily.   Follow up in 6 months. Office will contact you at a later time to schedule   Due to recent changes in healthcare laws, you may see the results of your imaging and laboratory studies on MyChart before your provider has had a chance to review them.  We understand that in some cases there may be results that are confusing or concerning to you. Not all laboratory results come back in the same time frame and the provider may be waiting for multiple results in order to interpret others.  Please give [place] 48 hours in order for your provider to thoroughly review all the results before contacting the office for clarification of your results.   _____________________________________________________  If you are age 49 or older, your body mass index should be between 23-30. Your Body mass index is 36.96 kg/m. If this is out of the aforementioned range listed, please consider follow up with your Primary Care Provider.  If you are age 49 or younger, your body mass index should be between 19-25. Your Body mass index is 36.96 kg/m. If this is out of the aformentioned range listed, please consider follow up with your Primary Care Provider.   _____________________________________________________  The [facility] providers would like to encourage you to use [facility] to communicate with providers for non-urgent requests or questions.  Due to long hold times on the telephone, sending your provider a message by [facility] may be a faster and more efficient way to get a response.  Please allow  48 business hours for a response.  Please remember that this is for non-urgent requests.  _______________________________________________________  Thank you for choosing me and [facility].  Dr.Nandigam

## 2022-03-01 NOTE — Progress Notes (Signed)
Jenny Carroll    [id number]    June 07, 1965  Primary Care Physician:White, Aram Beecham, MD  Referring Physician: Laurann Montana, MD [phone] [address] [place],  [place] [address]   Chief complaint:  Iron Def anemia  HPI:  56 year old very pleasant female here for follow-up visit for iron deficiency anemia She underwent EGD and colonoscopy December 28, 2021 for iron deficiency anemia and positive fecal Hemoccult  EGD: H. pylori gastritis otherwise unremarkable Colonoscopy: Hemorrhoids otherwise unremarkable Small bowel pill camera: Possible small AVM in distal small bowel otherwise unremarkable  She is s/p treatment for H. Pylori  On review of systems she has intermittent left lower quadrant abdominal discomfort otherwise denies any melena, rectal bleeding, upper abdominal pain or GERD  Her bowel habits are regular, has bowel movement every 2 to 3 days and sometimes has excess gas   Outpatient Encounter Medications as of 03/01/2022  Medication Sig   cholecalciferol (VITAMIN D) 1000 UNITS tablet Take 1,000 Units by mouth daily.   triamcinolone cream (KENALOG) 0.1 % Apply to affected area twice a day as needed   No facility-administered encounter medications on file as of 03/01/2022.    Allergies as of 03/01/2022   (No Known Allergies)    Past Medical History:  Diagnosis Date   Anemia    history of anemai, better since no periods anymore   Blood transfusion without reported diagnosis 04/18/1992    Past Surgical History:  Procedure Laterality Date   BREAST REDUCTION SURGERY Bilateral 12/09/2020   Procedure: BILATERAL BREAST REDUCTION WITH LIPOSUCTION;  Surgeon: Peggye Form, DO;  Location: [facility];  Service: Plastics;  Laterality: Bilateral;  3 hours   CESAREAN SECTION     x3    Family History  Problem Relation Age of Onset   Heart disease Maternal Uncle    Diabetes Maternal Grandmother    Colon cancer Neg Hx    Rectal  cancer Neg Hx    Stomach cancer Neg Hx    Esophageal cancer Neg Hx     Social History   Socioeconomic History   Marital status: Single    Spouse name: Not on file   Number of children: Not on file   Years of education: Not on file   Highest education level: Not on file  Occupational History   Not on file  Tobacco Use   Smoking status: Never   Smokeless tobacco: Never  Vaping Use   Vaping Use: Never used  Substance and Sexual Activity   Alcohol use: No    Alcohol/week: 0.0 standard drinks of alcohol   Drug use: No   Sexual activity: Yes  Other Topics Concern   Not on file  Social History Narrative   Not on file   Social Determinants of Health   Financial Resource Strain: Not on file  Food Insecurity: Not on file  Transportation Needs: Not on file  Physical Activity: Not on file  Stress: Not on file  Social Connections: Not on file  Intimate Partner Violence: Not on file      Review of systems: All other review of systems negative except as mentioned in the HPI.   Physical Exam: Vitals:   03/01/22 0824  BP: 120/72  Pulse: 68   Body mass index is 36.96 kg/m. Gen:      No acute distress HEENT:  sclera anicteric Abd:      soft, non-tender; no palpable masses, no  distension Ext:    No edema Neuro: alert and oriented x 3 Psych: normal mood and affect  Data Reviewed:  Reviewed labs, radiology imaging, old records and pertinent past GI work up   Assessment and Plan/Recommendations:  56 year old very pleasant female with history of fecal Hemoccult positive stool, iron deficiency anemia s/p EGD, colonoscopy and small bowel pill camera with H. pylori gastritis and small bowel isolated AVM  H. pylori gastritis status posttreatment.  We will check H. pylori stool antigen to confirm eradication  We will recheck CBC and iron panel, to check if she is still has persistent iron deficiency.  Advised patient to eat iron rich diet, will start iron supplements has  persistent needed  Chronic idiopathic constipation: Start Benefiber 1 tablespoon twice daily and increase daily water intake to 8 to 10 cups daily Left lower quadrant abdominal discomfort could be secondary to constipation but if has persistent pain, advised patient to contact PCP to further evaluate, may consider pelvic ultrasound or CT to exclude ovarian cysts, fibroids or pelvic inflammatory disease  Return in 6 months   The patient was provided an opportunity to ask questions and all were answered. The patient agreed with the plan and demonstrated an understanding of the instructions.  Iona Beard , MD    CC: Laurann Montana, MD

## 2022-03-07 ENCOUNTER — Other Ambulatory Visit: Payer: No Typology Code available for payment source

## 2022-03-07 DIAGNOSIS — K5904 Chronic idiopathic constipation: Secondary | ICD-10-CM

## 2022-03-07 DIAGNOSIS — B9681 Helicobacter pylori [H. pylori] as the cause of diseases classified elsewhere: Secondary | ICD-10-CM

## 2022-03-07 DIAGNOSIS — D509 Iron deficiency anemia, unspecified: Secondary | ICD-10-CM

## 2022-03-09 LAB — HELICOBACTER PYLORI  SPECIAL ANTIGEN
MICRO NUMBER:: 14212595
SPECIMEN QUALITY: ADEQUATE

## 2022-03-16 ENCOUNTER — Telehealth: Payer: Self-pay | Admitting: Gastroenterology

## 2022-03-16 NOTE — Telephone Encounter (Signed)
Patient called to go over her recent lab results with a nurse.

## 2022-03-17 NOTE — Telephone Encounter (Signed)
See the iron studies and B 12. Patient inquiring about taking iron or B 12 supplements.

## 2022-03-25 NOTE — Telephone Encounter (Signed)
Patient is calling awaiting response and is upset she hasn't heard anything in a week. Please advise

## 2022-03-28 NOTE — Telephone Encounter (Signed)
Spoke with the patient. See the labs from November.

## 2022-04-19 DIAGNOSIS — E538 Deficiency of other specified B group vitamins: Secondary | ICD-10-CM | POA: Diagnosis not present

## 2022-04-23 ENCOUNTER — Telehealth: Payer: Self-pay | Admitting: *Deleted

## 2022-04-23 NOTE — Telephone Encounter (Signed)
No auth req for CPT 22900, 22902, 22903 under new [employer] plan:

## 2022-04-28 ENCOUNTER — Telehealth: Payer: Self-pay | Admitting: *Deleted

## 2022-04-28 DIAGNOSIS — E538 Deficiency of other specified B group vitamins: Secondary | ICD-10-CM | POA: Diagnosis not present

## 2022-04-28 NOTE — Telephone Encounter (Signed)
Received on (04/07/2022) via fax Medical Management Request from [employer].  Requesting procedure results.  Faxed back request stating Procedure was not performed.  Confirmation received//AB/CMA

## 2022-04-29 ENCOUNTER — Telehealth: Payer: Self-pay | Admitting: *Deleted

## 2022-04-29 NOTE — Telephone Encounter (Signed)
LVM to schedule sx 

## 2022-05-03 ENCOUNTER — Telehealth: Payer: Self-pay | Admitting: Plastic Surgery

## 2022-05-03 NOTE — Telephone Encounter (Signed)
TRLVM - READY TO SCHEDULE SURGERY

## 2022-05-05 DIAGNOSIS — E538 Deficiency of other specified B group vitamins: Secondary | ICD-10-CM | POA: Diagnosis not present

## 2022-05-12 DIAGNOSIS — E538 Deficiency of other specified B group vitamins: Secondary | ICD-10-CM | POA: Diagnosis not present

## 2022-06-10 DIAGNOSIS — E538 Deficiency of other specified B group vitamins: Secondary | ICD-10-CM | POA: Diagnosis not present

## 2022-06-14 ENCOUNTER — Encounter: Payer: Self-pay | Admitting: Gastroenterology

## 2022-06-20 ENCOUNTER — Encounter: Payer: Self-pay | Admitting: Gastroenterology

## 2022-06-22 ENCOUNTER — Telehealth: Payer: Self-pay | Admitting: *Deleted

## 2022-06-22 NOTE — Telephone Encounter (Signed)
Spoke with patient about scheduling sx. She stated she would contact us when ready to move forward as she currently has other things to take care of.

## 2022-07-08 DIAGNOSIS — E538 Deficiency of other specified B group vitamins: Secondary | ICD-10-CM | POA: Diagnosis not present

## 2022-07-20 DIAGNOSIS — Z1231 Encounter for screening mammogram for malignant neoplasm of breast: Secondary | ICD-10-CM | POA: Diagnosis not present

## 2022-08-18 DIAGNOSIS — E538 Deficiency of other specified B group vitamins: Secondary | ICD-10-CM | POA: Diagnosis not present

## 2022-09-20 ENCOUNTER — Other Ambulatory Visit: Payer: Self-pay | Admitting: Family Medicine

## 2022-09-20 DIAGNOSIS — E041 Nontoxic single thyroid nodule: Secondary | ICD-10-CM

## 2022-09-26 DIAGNOSIS — E041 Nontoxic single thyroid nodule: Secondary | ICD-10-CM | POA: Diagnosis not present

## 2022-09-26 DIAGNOSIS — D509 Iron deficiency anemia, unspecified: Secondary | ICD-10-CM | POA: Diagnosis not present

## 2022-09-26 DIAGNOSIS — E6609 Other obesity due to excess calories: Secondary | ICD-10-CM | POA: Diagnosis not present

## 2022-09-26 DIAGNOSIS — E538 Deficiency of other specified B group vitamins: Secondary | ICD-10-CM | POA: Diagnosis not present

## 2022-09-26 DIAGNOSIS — M170 Bilateral primary osteoarthritis of knee: Secondary | ICD-10-CM | POA: Diagnosis not present

## 2022-09-26 DIAGNOSIS — Z0000 Encounter for general adult medical examination without abnormal findings: Secondary | ICD-10-CM | POA: Diagnosis not present

## 2022-09-26 DIAGNOSIS — Z13220 Encounter for screening for lipoid disorders: Secondary | ICD-10-CM | POA: Diagnosis not present

## 2022-09-26 DIAGNOSIS — E559 Vitamin D deficiency, unspecified: Secondary | ICD-10-CM | POA: Diagnosis not present

## 2022-09-26 DIAGNOSIS — M7918 Myalgia, other site: Secondary | ICD-10-CM | POA: Diagnosis not present

## 2022-10-03 ENCOUNTER — Other Ambulatory Visit (HOSPITAL_COMMUNITY): Payer: Self-pay

## 2022-10-04 ENCOUNTER — Other Ambulatory Visit (HOSPITAL_COMMUNITY): Payer: Self-pay

## 2022-10-04 MED ORDER — CELECOXIB 200 MG PO CAPS
200.0000 mg | ORAL_CAPSULE | Freq: Every day | ORAL | 0 refills | Status: DC | PRN
  Filled 2022-10-04: qty 30, 30d supply, fill #0

## 2022-10-05 ENCOUNTER — Other Ambulatory Visit (HOSPITAL_COMMUNITY): Payer: Self-pay

## 2022-10-17 ENCOUNTER — Ambulatory Visit
Admission: RE | Admit: 2022-10-17 | Discharge: 2022-10-17 | Disposition: A | Discharge status: Home / Self Care | Payer: 59 | Source: Ambulatory Visit | Attending: Family Medicine | Admitting: Family Medicine

## 2022-10-17 DIAGNOSIS — E041 Nontoxic single thyroid nodule: Secondary | ICD-10-CM

## 2022-10-17 DIAGNOSIS — E042 Nontoxic multinodular goiter: Secondary | ICD-10-CM | POA: Diagnosis not present

## 2022-12-30 DIAGNOSIS — E538 Deficiency of other specified B group vitamins: Secondary | ICD-10-CM | POA: Diagnosis not present

## 2022-12-30 DIAGNOSIS — E559 Vitamin D deficiency, unspecified: Secondary | ICD-10-CM | POA: Diagnosis not present

## 2023-01-19 IMAGING — US US THYROID
1 series · 13 of 25 positions shown · non-contrast
Comparison: 01/15/2018, 12/13/2016, 08/17/2020

CLINICAL DATA: 55-year-old female with a history of thyroid nodules

EXAM:
THYROID ULTRASOUND
TECHNIQUE: Ultrasound examination of the thyroid gland and adjacent soft
tissues was performed.

[Series 1: us thyroid · 0.05mm/px · 13 of 56 slices shown]
[im 1/56]
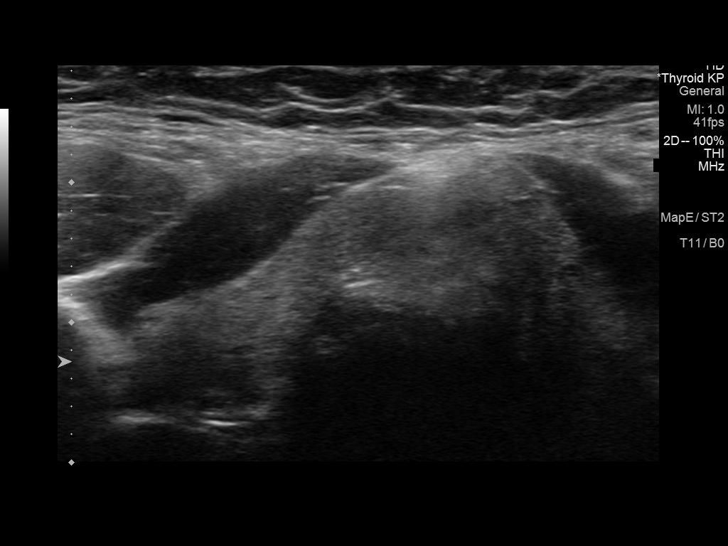
[im 5/56]
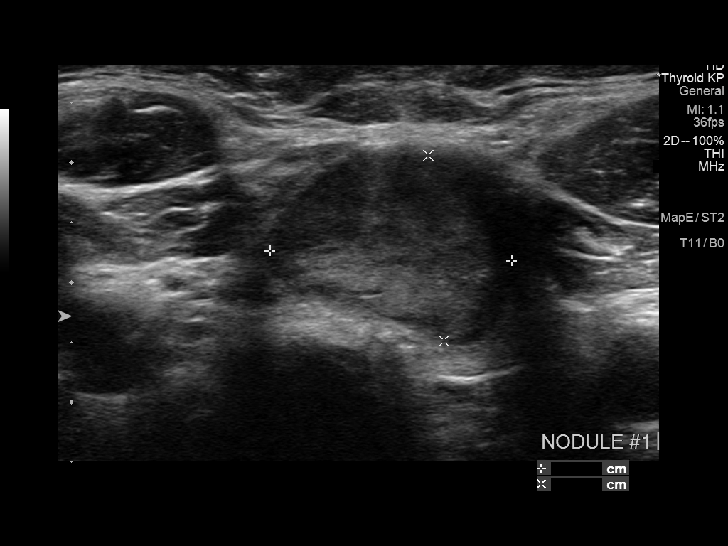
[im 10/56]
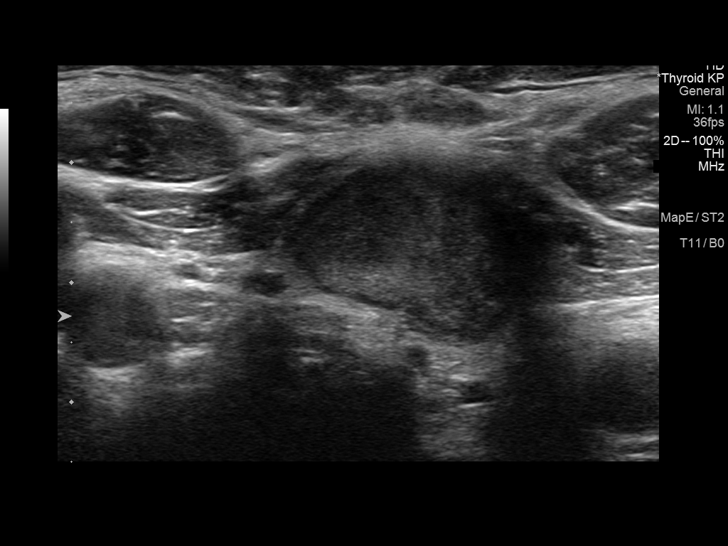
[im 14/56]
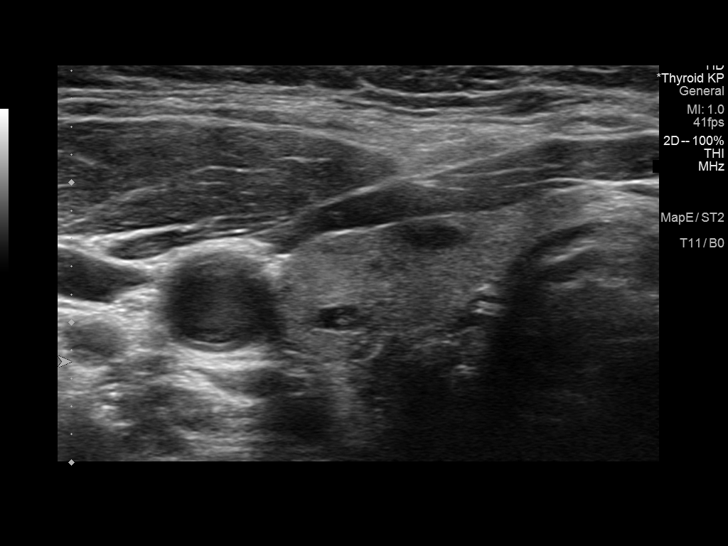
[im 19/56]
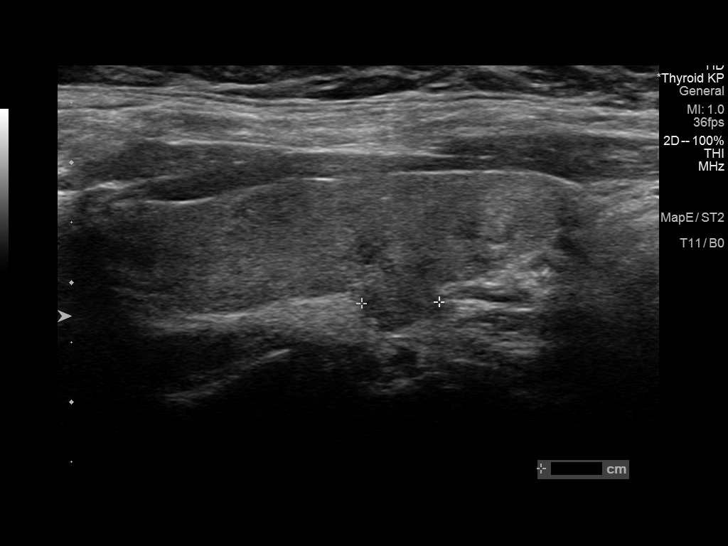
[im 23/56]
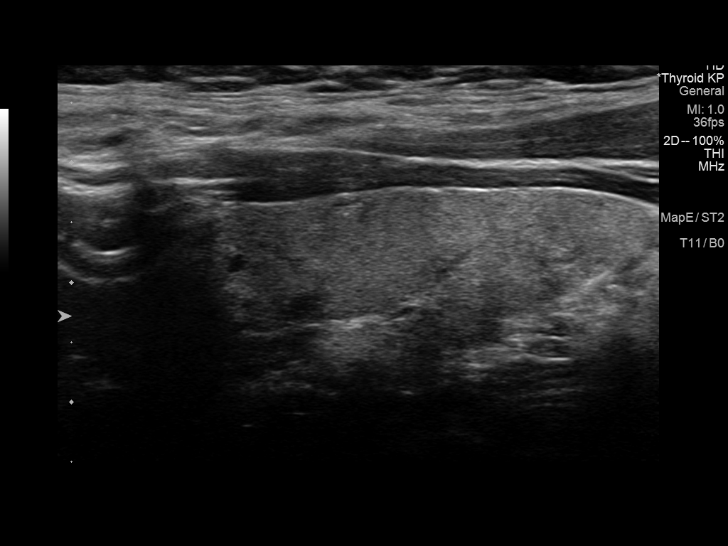
[im 28/56]
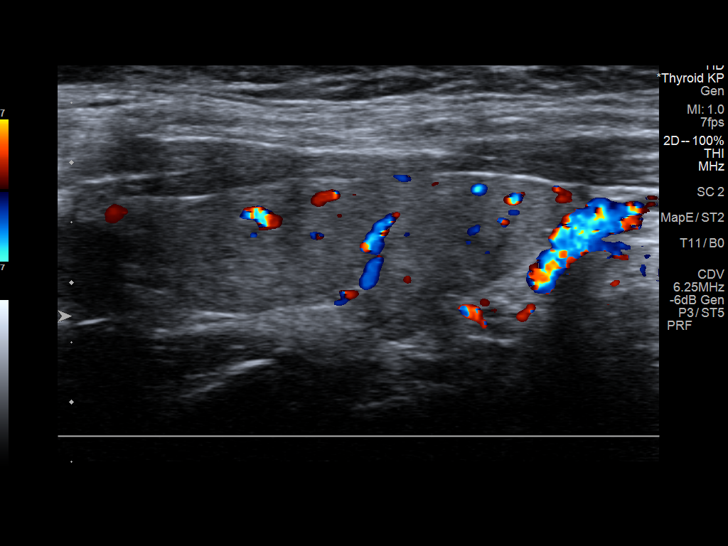
[im 33/56]
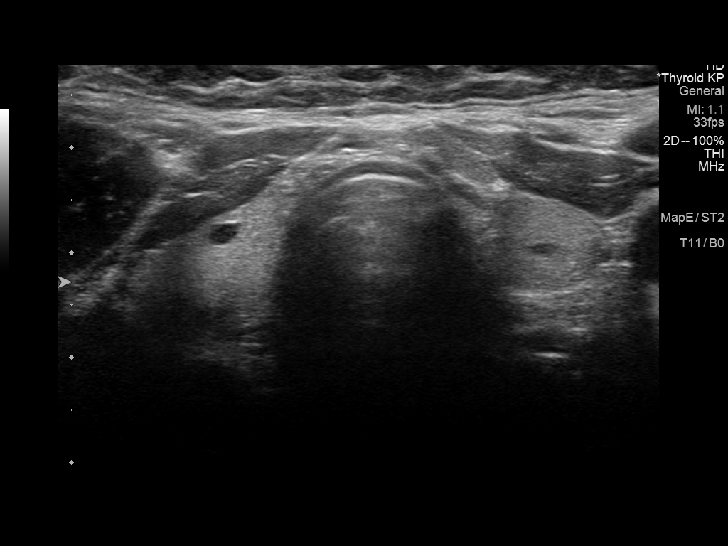
[im 37/56]
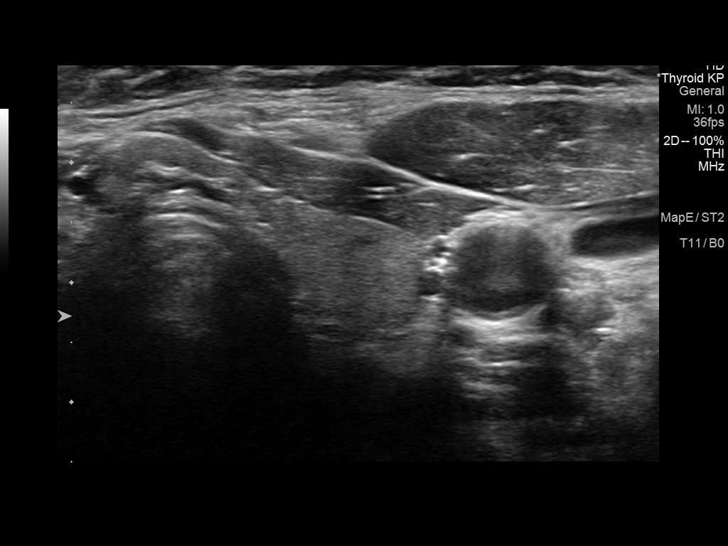
[im 42/56]
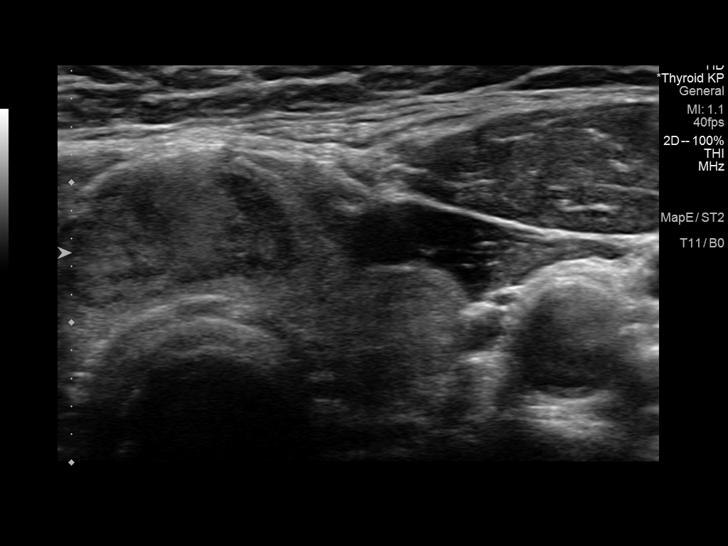
[im 46/56]
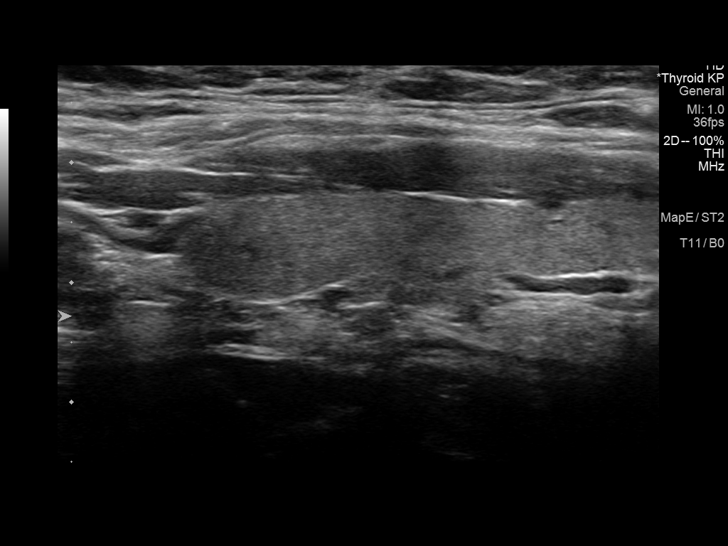
[im 51/56]
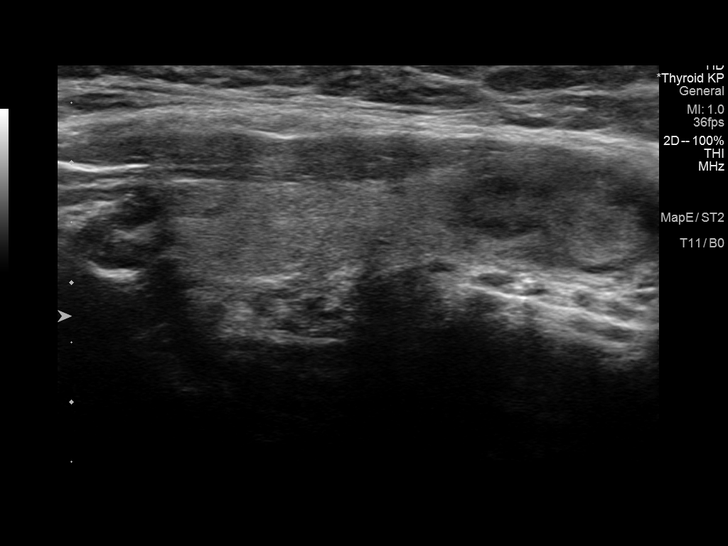
[im 56/56]
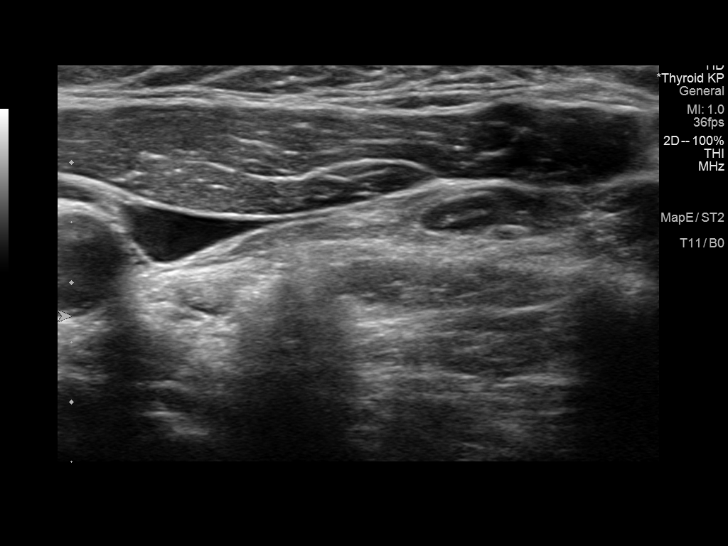

[13 of 25 positions shown; findings below may reference images not displayed]

FINDINGS: Parenchymal Echotexture: Mildly heterogenous

Isthmus: 1.2 cm

Right lobe: 4.1 cm x 1.4 cm x 1.6 cm

Left lobe: 4.2 cm x 1.0 cm x 1.2 cm

_________________________________________________________

Estimated total number of nodules >/= 1 cm: 1

Number of spongiform nodules >/=  2 cm not described below (TR1): 0

Number of mixed cystic and solid nodules >/= 1.5 cm not described
below (TR2): 0

_________________________________________________________

Nodule labeled 1 in the isthmus, 2.3 cm, unchanged. This remains TR
3 characteristics. The current ultrasound represents nearly 5 years
of unchanged appearance.

Nodule identified at the posterior right thyroid lobe, is a separate
nodule than the previously measured. The current is estimated 7 mm,
and is either projecting posteriorly from the thyroid tissue or is a
separate nodule inseparable from the posterior thyroid margin. TR 3
characteristics.

No adenopathy
IMPRESSION: Isthmic thyroid nodule (labeled 1, 2.3 cm, TR 3) remains unchanged.
Surveillance ultrasound study recommended to be performed annually
up to 5 years, and 1 additional year of surveillance is reasonable
based on the baseline date of 12/13/2016.

Nodule at the posterior right thyroid is newly measured on today's
study. This either represents a TR 3 nodule projecting posteriorly
from the thyroid, or potentially a parathyroid gland inseparable
from the posterior margin of the thyroid.

Recommendations follow those established by the new ACR TI-RADS
criteria ([HOSPITAL] 9103;[DATE]).

## 2023-02-01 NOTE — Progress Notes (Signed)
Jenny Carroll    [id number]    February 06, 1966  Primary Care Physician:White, Aram Beecham, MD  Referring Physician: Laurann Montana, MD [phone] [place],  [place] [address]   Chief complaint:   Chief Complaint  Patient presents with   IDA    No complaints today   HPI: 57 year old very pleasant female here for follow-up visit for iron deficiency anemia. Last seen on 03-01-22.  Today, she reports feeling well overall with no GI complains. She does however complains of feeling fatigued throughout the day. Her hemoglobin was last checked in June/July but she's unsure what the levels were but states that Dr. Cliffton Asters did not mention anything about it.  She denies any black stool or blood in stool. She also states that she her symptoms have resolved after her H. Pylori treatment. She states that she has finished her vitamin B12 injections.    Patient denies any diarrhea, constipation, nausea, vomiting, abdominal pain, bloating, unintentional weight loss, reflux, dysphagia.    GI Hx: Capsul EGD 01-24-22 One tiny AVM at 54 min mark, non-bleeding - this is about 34 mins distal to first duodenal image.   She underwent EGD and colonoscopy December 28, 2021 for iron deficiency anemia and positive fecal Hemoccult  EGD: H. pylori gastritis otherwise unremarkable Colonoscopy: Hemorrhoids otherwise unremarkable Small bowel pill camera: Possible small AVM in distal small bowel otherwise unremarkable  She is s/p treatment for H. Pylori  On review of systems she has intermittent left lower quadrant abdominal discomfort otherwise denies any melena, rectal bleeding, upper abdominal pain or GERD  Her bowel habits are regular, has bowel movement every 2 to 3 days and sometimes has excess gas  Current Outpatient Medications:    cholecalciferol (VITAMIN D) 1000 UNITS tablet, Take 1,000 Units by mouth daily., Disp: , Rfl:     Allergies as of 02/03/2023   (No Known Allergies)     Past Medical History:  Diagnosis Date   Anemia    history of anemai, better since no periods anymore   Blood transfusion without reported diagnosis 04/18/1992    Past Surgical History:  Procedure Laterality Date   BREAST REDUCTION SURGERY Bilateral 12/09/2020   Procedure: BILATERAL BREAST REDUCTION WITH LIPOSUCTION;  Surgeon: Peggye Form, DO;  Location: [facility];  Service: Plastics;  Laterality: Bilateral;  3 hours   CESAREAN SECTION     x3    Family History  Problem Relation Age of Onset   Heart disease Maternal Uncle    Diabetes Maternal Grandmother    Colon cancer Neg Hx    Rectal cancer Neg Hx    Stomach cancer Neg Hx    Esophageal cancer Neg Hx     Social History   Socioeconomic History   Marital status: Single    Spouse name: Not on file   Number of children: Not on file   Years of education: Not on file   Highest education level: Not on file  Occupational History   Not on file  Tobacco Use   Smoking status: Never   Smokeless tobacco: Never  Vaping Use   Vaping status: Never Used  Substance and Sexual Activity   Alcohol use: No    Alcohol/week: 0.0 standard drinks of alcohol   Drug use: No   Sexual activity: Yes  Other Topics Concern   Not on file  Social History Narrative   Not on file   Social  Determinants of Health   Financial Resource Strain: Not on file  Food Insecurity: Not on file  Transportation Needs: Not on file  Physical Activity: Not on file  Stress: Not on file  Social Connections: Not on file  Intimate Partner Violence: Not on file     Review of systems: Review of Systems  Constitutional:  Positive for fatigue. Negative for unexpected weight change.  HENT:  Negative for trouble swallowing.   Gastrointestinal:  Negative for abdominal distention, abdominal pain, anal bleeding, blood in stool, constipation, diarrhea, nausea, rectal pain and vomiting.    Physical Exam: General: well-appearing   Eyes:  sclera anicteric, no redness ENT: oral mucosa moist without lesions, no cervical or supraclavicular lymphadenopathy CV: RRR, no JVD, no peripheral edema Resp: clear to auscultation bilaterally, normal RR and effort noted GI: soft, no tenderness, with active bowel sounds. No guarding or palpable organomegaly noted. Skin; warm and dry, no rash or jaundice noted Neuro: awake, alert and oriented x 3. Normal gross motor function and fluent speech   Data Reviewed:  Reviewed labs, radiology imaging, old records and pertinent past GI work up   Assessment and Plan/Recommendations:  57 year old very pleasant female with history of fecal Hemoccult positive stool, iron deficiency anemia s/p EGD, colonoscopy and small bowel pill camera with H. pylori gastritis and small bowel isolated AVM    Iron Deficiency Anemia Borderline low hemoglobin (11.6) and low iron level. History of gastritis and H. pylori infection, treated. Small AVM noted on small bowel capsule, not actively bleeding. No current signs of bleeding (black stool, blood in stool). -Order blood work today to check hemoglobin and iron levels. -Check blood counts every six months and iron levels annually through PCP.  Vitamin B12 Deficiency History of low B12, previously treated with B12 injections. Unclear if current levels are adequate. -Order blood work today to check B12 levels. -Advise to take multivitamin and SL B12 daily. -If B12 levels are low, consider restarting B12 injections.  H. Pylori Infection Treated in the past, unclear if infection has cleared. -Order stool test to confirm clearance of H. pylori infection.  Colonoscopy Normal, next due in 2033.  Follow-up As needed, continue routine care with Dr. Cliffton Asters. Ensure annual check of blood count, iron level, and B12 level.       The patient was provided an opportunity to ask questions and all were answered. The patient agreed with the plan and demonstrated an understanding  of the instructions.  Iona Beard , MD   Ladona Mow Hewitt Shorts as a scribe for Marsa Aris, MD.,have documented all relevant documentation on the behalf of Marsa Aris, MD,as directed by  Marsa Aris, MD while in the presence of Marsa Aris, MD.   I, Marsa Aris, MD, have reviewed all documentation for this visit. The documentation on 02/03/23 for the exam, diagnosis, procedures, and orders are all accurate and complete.    CC: Laurann Montana, MD

## 2023-02-03 ENCOUNTER — Other Ambulatory Visit (INDEPENDENT_AMBULATORY_CARE_PROVIDER_SITE_OTHER): Payer: 59

## 2023-02-03 ENCOUNTER — Ambulatory Visit: Payer: 59 | Admitting: Gastroenterology

## 2023-02-03 ENCOUNTER — Encounter: Payer: Self-pay | Admitting: Gastroenterology

## 2023-02-03 VITALS — BP 130/76 | HR 91 | Ht 66.0 in | Wt 230.4 lb

## 2023-02-03 DIAGNOSIS — E538 Deficiency of other specified B group vitamins: Secondary | ICD-10-CM

## 2023-02-03 DIAGNOSIS — K2970 Gastritis, unspecified, without bleeding: Secondary | ICD-10-CM

## 2023-02-03 DIAGNOSIS — B9681 Helicobacter pylori [H. pylori] as the cause of diseases classified elsewhere: Secondary | ICD-10-CM | POA: Diagnosis not present

## 2023-02-03 DIAGNOSIS — D509 Iron deficiency anemia, unspecified: Secondary | ICD-10-CM

## 2023-02-03 LAB — IBC + FERRITIN
Ferritin: 51.7 ng/mL (ref 10.0–291.0)
Iron: 72 ug/dL (ref 42–145)
Saturation Ratios: 23.1 % (ref 20.0–50.0)
TIBC: 312.2 ug/dL (ref 250.0–450.0)
Transferrin: 223 mg/dL (ref 212.0–360.0)

## 2023-02-03 LAB — HEMOGLOBIN AND HEMATOCRIT, BLOOD
HCT: 37.6 % (ref 36.0–46.0)
Hemoglobin: 11.6 g/dL — ABNORMAL LOW (ref 12.0–15.0)

## 2023-02-03 LAB — B12 AND FOLATE PANEL
Folate: 8.9 ng/mL (ref >5.9)
Vitamin B-12: 218 pg/mL (ref 211–911)

## 2023-02-03 NOTE — Patient Instructions (Addendum)
VISIT SUMMARY:  Dear Jenny Carroll, during your recent visit, we discussed your ongoing fatigue and history of gastritis due to H. Pylori infection. We also reviewed your past treatments for iron deficiency anemia and vitamin B12 deficiency. Your hemoglobin and iron levels were slightly low, and we are unsure about your current B12 levels. We have planned some tests to better understand your condition and guide your treatment.  YOUR PLAN:  -IRON DEFICIENCY ANEMIA: This is a condition where your body lacks enough iron to produce hemoglobin, a substance in your red blood cells that allows them to carry oxygen. We will check your blood for hemoglobin and iron levels today. We will also monitor your blood counts every six months and iron levels annually.  -VITAMIN B12 DEFICIENCY: This is a condition where your body does not have enough vitamin B12, which is important for making red blood cells and keeping your nervous system healthy. We will check your B12 levels today. We advise you to take a multivitamin and B12 daily. If your B12 levels are low, we may consider restarting B12 injections.  -H. PYLORI INFECTION: This is a type of bacteria that can infect your stomach and is a common cause of gastritis. You were treated for this in the past, and we will order a stool test to confirm if the infection has cleared.  -COLONOSCOPY: This is a test that allows your doctor to look at the inner lining of your large intestine. Your last colonoscopy was normal, and your next one is due in 2033.  INSTRUCTIONS:  Please continue your routine care with Dr. Cliffton Asters. Make sure to have your blood count, iron level, and B12 level checked annually. If you have any concerns or if your symptoms worsen, please contact us immediately.  I appreciate the  opportunity to care for you  Thank You   Marsa Aris , MD

## 2023-02-09 ENCOUNTER — Other Ambulatory Visit: Payer: 59

## 2023-02-09 DIAGNOSIS — E538 Deficiency of other specified B group vitamins: Secondary | ICD-10-CM | POA: Diagnosis not present

## 2023-02-09 DIAGNOSIS — B9681 Helicobacter pylori [H. pylori] as the cause of diseases classified elsewhere: Secondary | ICD-10-CM

## 2023-02-09 DIAGNOSIS — D509 Iron deficiency anemia, unspecified: Secondary | ICD-10-CM | POA: Diagnosis not present

## 2023-02-09 DIAGNOSIS — K2970 Gastritis, unspecified, without bleeding: Secondary | ICD-10-CM | POA: Diagnosis not present

## 2023-02-11 LAB — H. PYLORI ANTIGEN, STOOL: H pylori Ag, Stl: NEGATIVE

## 2023-02-16 ENCOUNTER — Other Ambulatory Visit: Payer: Self-pay

## 2023-02-16 ENCOUNTER — Other Ambulatory Visit (HOSPITAL_COMMUNITY): Payer: Self-pay

## 2023-02-16 MED ORDER — CYANOCOBALAMIN 1000 MCG/ML IJ SOLN
1000.0000 ug | INTRAMUSCULAR | 0 refills | Status: AC
  Filled 2023-02-16: qty 4, 28d supply, fill #0

## 2023-02-17 ENCOUNTER — Other Ambulatory Visit: Payer: Self-pay

## 2023-02-17 ENCOUNTER — Other Ambulatory Visit (HOSPITAL_COMMUNITY): Payer: Self-pay

## 2023-02-17 DIAGNOSIS — E538 Deficiency of other specified B group vitamins: Secondary | ICD-10-CM

## 2023-02-17 MED ORDER — CYANOCOBALAMIN 1000 MCG/ML IJ SOLN
1000.0000 ug | INTRAMUSCULAR | Status: AC
Start: 2023-02-17 — End: 2023-04-28
  Administered 2023-02-22 – 2023-04-28 (×5): 1000 ug via INTRAMUSCULAR

## 2023-02-22 ENCOUNTER — Other Ambulatory Visit (HOSPITAL_COMMUNITY): Payer: Self-pay

## 2023-02-22 ENCOUNTER — Ambulatory Visit (INDEPENDENT_AMBULATORY_CARE_PROVIDER_SITE_OTHER): Payer: 59 | Admitting: Gastroenterology

## 2023-02-22 DIAGNOSIS — E538 Deficiency of other specified B group vitamins: Secondary | ICD-10-CM | POA: Diagnosis not present

## 2023-03-02 ENCOUNTER — Ambulatory Visit (INDEPENDENT_AMBULATORY_CARE_PROVIDER_SITE_OTHER): Payer: 59 | Admitting: Gastroenterology

## 2023-03-02 DIAGNOSIS — E538 Deficiency of other specified B group vitamins: Secondary | ICD-10-CM

## 2023-03-08 NOTE — Telephone Encounter (Signed)
Error

## 2023-03-10 ENCOUNTER — Ambulatory Visit (INDEPENDENT_AMBULATORY_CARE_PROVIDER_SITE_OTHER): Payer: 59 | Admitting: Gastroenterology

## 2023-03-10 DIAGNOSIS — E538 Deficiency of other specified B group vitamins: Secondary | ICD-10-CM

## 2023-03-21 ENCOUNTER — Ambulatory Visit: Payer: 59 | Admitting: Gastroenterology

## 2023-03-21 DIAGNOSIS — E538 Deficiency of other specified B group vitamins: Secondary | ICD-10-CM | POA: Diagnosis not present

## 2023-03-22 ENCOUNTER — Ambulatory Visit: Payer: 59

## 2023-04-03 ENCOUNTER — Other Ambulatory Visit: Payer: Self-pay

## 2023-04-03 ENCOUNTER — Other Ambulatory Visit (HOSPITAL_COMMUNITY): Payer: Self-pay

## 2023-04-03 MED ORDER — CYANOCOBALAMIN 1000 MCG/ML IJ SOLN
1000.0000 ug | INTRAMUSCULAR | 11 refills | Status: DC
  Filled 2023-04-03: qty 1, 30d supply, fill #0

## 2023-04-13 ENCOUNTER — Other Ambulatory Visit (HOSPITAL_COMMUNITY): Payer: Self-pay

## 2023-04-21 ENCOUNTER — Other Ambulatory Visit (HOSPITAL_COMMUNITY): Payer: Self-pay

## 2023-04-21 ENCOUNTER — Ambulatory Visit: Payer: 59

## 2023-04-21 ENCOUNTER — Telehealth: Payer: Self-pay | Admitting: Gastroenterology

## 2023-04-21 MED ORDER — CYANOCOBALAMIN 1000 MCG/ML IJ SOLN
1000.0000 ug | INTRAMUSCULAR | 6 refills | Status: DC
  Filled 2023-04-21: qty 1, 30d supply, fill #0
  Filled 2023-05-25: qty 1, 30d supply, fill #1

## 2023-04-21 NOTE — Telephone Encounter (Signed)
 Refilled patients B12

## 2023-04-21 NOTE — Telephone Encounter (Signed)
 Patient called and stated that she is needing a refill on her B12. Please advise.

## 2023-04-28 ENCOUNTER — Other Ambulatory Visit (HOSPITAL_COMMUNITY): Payer: Self-pay

## 2023-04-28 ENCOUNTER — Ambulatory Visit: Payer: 59 | Admitting: Gastroenterology

## 2023-04-28 DIAGNOSIS — E538 Deficiency of other specified B group vitamins: Secondary | ICD-10-CM

## 2023-05-19 ENCOUNTER — Telehealth: Payer: Self-pay | Admitting: Gastroenterology

## 2023-05-19 NOTE — Telephone Encounter (Signed)
Dr Lavon Paganini, Patient wants to know if she needs to continue B12 injections, Looks like last B12 lab was in October and normal. Thanks

## 2023-05-19 NOTE — Telephone Encounter (Signed)
Called patient and informed her, Left message that she needs to continue her B12 injections

## 2023-05-19 NOTE — Telephone Encounter (Signed)
She will need to continue Monthly b12 injections

## 2023-05-19 NOTE — Telephone Encounter (Signed)
Inbound call from patient requesting a call to discuss B12 injections. States she has injections refilled she has to pay. States previously it did not cost her anything out of pocket. Patient is requesting a call back to discuss further. Please advise, thank you.

## 2023-05-25 ENCOUNTER — Ambulatory Visit (INDEPENDENT_AMBULATORY_CARE_PROVIDER_SITE_OTHER): Payer: 59

## 2023-05-25 ENCOUNTER — Other Ambulatory Visit (HOSPITAL_COMMUNITY): Payer: Self-pay

## 2023-05-25 ENCOUNTER — Other Ambulatory Visit: Payer: Self-pay

## 2023-05-25 DIAGNOSIS — E538 Deficiency of other specified B group vitamins: Secondary | ICD-10-CM

## 2023-05-25 MED ORDER — CYANOCOBALAMIN 1000 MCG/ML IJ SOLN
1000.0000 ug | INTRAMUSCULAR | Status: AC
Start: 2023-05-31 — End: 2023-05-25
  Administered 2023-05-25: 1000 ug via INTRAMUSCULAR

## 2023-05-26 ENCOUNTER — Ambulatory Visit: Payer: 59

## 2023-07-04 ENCOUNTER — Telehealth: Payer: Self-pay | Admitting: Gastroenterology

## 2023-07-04 NOTE — Telephone Encounter (Signed)
 Contacted the patient. Confirmed PCP. B12 labs faxed to Dr Cliffton Asters. The patient will make a follow up phone call tomorrow. She will let me know if she needs further assistance or records.

## 2023-07-04 NOTE — Telephone Encounter (Signed)
 Patient called and stated that she was suppose to have her B12 shots transferred over to her PCP. Patient stated that [facility] has not received her B12 shot that was suppose to be transferred over back in January. Patient is requesting a call back. Please advise.

## 2023-07-07 DIAGNOSIS — E538 Deficiency of other specified B group vitamins: Secondary | ICD-10-CM | POA: Diagnosis not present

## 2023-07-07 DIAGNOSIS — N950 Postmenopausal bleeding: Secondary | ICD-10-CM | POA: Diagnosis not present

## 2023-07-07 DIAGNOSIS — N939 Abnormal uterine and vaginal bleeding, unspecified: Secondary | ICD-10-CM | POA: Diagnosis not present

## 2023-07-11 ENCOUNTER — Other Ambulatory Visit: Payer: Self-pay | Admitting: Family Medicine

## 2023-07-11 DIAGNOSIS — N939 Abnormal uterine and vaginal bleeding, unspecified: Secondary | ICD-10-CM

## 2023-07-14 ENCOUNTER — Other Ambulatory Visit

## 2023-07-17 ENCOUNTER — Other Ambulatory Visit

## 2023-07-21 ENCOUNTER — Other Ambulatory Visit

## 2023-07-25 ENCOUNTER — Ambulatory Visit
Admission: RE | Admit: 2023-07-25 | Discharge: 2023-07-25 | Disposition: A | Discharge status: Home / Self Care | Source: Ambulatory Visit | Attending: Family Medicine | Admitting: Family Medicine

## 2023-07-25 DIAGNOSIS — N939 Abnormal uterine and vaginal bleeding, unspecified: Secondary | ICD-10-CM

## 2023-07-29 DIAGNOSIS — Z1231 Encounter for screening mammogram for malignant neoplasm of breast: Secondary | ICD-10-CM | POA: Diagnosis not present

## 2023-08-15 DIAGNOSIS — N950 Postmenopausal bleeding: Secondary | ICD-10-CM | POA: Diagnosis not present

## 2023-08-15 DIAGNOSIS — R030 Elevated blood-pressure reading, without diagnosis of hypertension: Secondary | ICD-10-CM | POA: Diagnosis not present

## 2023-08-15 DIAGNOSIS — N840 Polyp of corpus uteri: Secondary | ICD-10-CM | POA: Diagnosis not present

## 2023-08-18 DIAGNOSIS — E538 Deficiency of other specified B group vitamins: Secondary | ICD-10-CM | POA: Diagnosis not present

## 2023-09-06 DIAGNOSIS — N840 Polyp of corpus uteri: Secondary | ICD-10-CM | POA: Diagnosis not present

## 2023-09-06 DIAGNOSIS — N950 Postmenopausal bleeding: Secondary | ICD-10-CM | POA: Diagnosis not present

## 2023-09-06 DIAGNOSIS — R9389 Abnormal findings on diagnostic imaging of other specified body structures: Secondary | ICD-10-CM | POA: Diagnosis not present

## 2023-09-15 DIAGNOSIS — E538 Deficiency of other specified B group vitamins: Secondary | ICD-10-CM | POA: Diagnosis not present

## 2023-09-29 DIAGNOSIS — Z13220 Encounter for screening for lipoid disorders: Secondary | ICD-10-CM | POA: Diagnosis not present

## 2023-09-29 DIAGNOSIS — E041 Nontoxic single thyroid nodule: Secondary | ICD-10-CM | POA: Diagnosis not present

## 2023-09-29 DIAGNOSIS — D351 Benign neoplasm of parathyroid gland: Secondary | ICD-10-CM | POA: Diagnosis not present

## 2023-09-29 DIAGNOSIS — M170 Bilateral primary osteoarthritis of knee: Secondary | ICD-10-CM | POA: Diagnosis not present

## 2023-09-29 DIAGNOSIS — E669 Obesity, unspecified: Secondary | ICD-10-CM | POA: Diagnosis not present

## 2023-09-29 DIAGNOSIS — Z0000 Encounter for general adult medical examination without abnormal findings: Secondary | ICD-10-CM | POA: Diagnosis not present

## 2023-09-29 DIAGNOSIS — D509 Iron deficiency anemia, unspecified: Secondary | ICD-10-CM | POA: Diagnosis not present

## 2023-09-29 DIAGNOSIS — E538 Deficiency of other specified B group vitamins: Secondary | ICD-10-CM | POA: Diagnosis not present

## 2023-09-29 DIAGNOSIS — E559 Vitamin D deficiency, unspecified: Secondary | ICD-10-CM | POA: Diagnosis not present

## 2023-11-22 DIAGNOSIS — N950 Postmenopausal bleeding: Secondary | ICD-10-CM | POA: Diagnosis not present

## 2023-11-22 DIAGNOSIS — N840 Polyp of corpus uteri: Secondary | ICD-10-CM | POA: Diagnosis not present

## 2023-11-22 DIAGNOSIS — R9389 Abnormal findings on diagnostic imaging of other specified body structures: Secondary | ICD-10-CM | POA: Diagnosis not present

## 2023-12-04 ENCOUNTER — Encounter (HOSPITAL_COMMUNITY): Payer: Self-pay | Admitting: Obstetrics and Gynecology

## 2023-12-04 NOTE — Progress Notes (Signed)
 Spoke w/ via phone for pre-op interview--- [facility] needs dos---- NONE        Lab results------ COVID test -----patient states asymptomatic no test needed Arrive at -------0730 NPO after MN NO Solid Food.   Pre-Surgery Ensure or G2:  Med rec completed Medications to take morning of surgery -----NONE Diabetic medication -----  GLP1 agonist last dose: GLP1 instructions:  Patient instructed no nail polish to be worn day of surgery Patient instructed to bring photo id and insurance card day of surgery Patient aware to have Driver (ride ) / caregiver    for 24 hours after surgery - Grand daughter Render Leaks Patient Special Instructions ----- Pre-Op special Instructions -----  Patient verbalized understanding of instructions that were given at this phone interview. Patient denies chest pain, sob, fever, cough at the interview.

## 2023-12-05 ENCOUNTER — Other Ambulatory Visit: Payer: Self-pay | Admitting: Obstetrics and Gynecology

## 2023-12-05 DIAGNOSIS — N950 Postmenopausal bleeding: Secondary | ICD-10-CM

## 2023-12-05 NOTE — H&P (Deleted)
   The note originally documented on this encounter has been moved the the encounter in which it belongs.

## 2023-12-05 NOTE — H&P (Signed)
 Reason for Appointment   1.  Preop visit History of Present Illness    General:    58 y/o presents for pre-op visit. Pt is hysteroscopy Dilation and curettage removal of endometrial polyp with myosure on 12/06/2023 for the management of PMB, endometrial polyp, and thickened endometrium.   IN REVIEW:  Last menses was 2011. She started having uterine bleeding march 2025. she reports bleeding every other day until 5 days ago.  --- EMB results from 08/15/2023 revealed a benign endometrial polyp.  Current Medications Taking Vitamin B-12 2500 MCG Tablet Sublingual as directed Sublingual Vitamin D3 5000 UNIT Capsule 1 capsule Orally Once a day , Notes to Pharmacist: intermittently Medication List reviewed and reconciled with the patient Past Medical History iron deficiency anemia, improved after menopause, then recured, Dr Shila 2023. vitamin D deficiency. Menopausal. Obesity. Thyroid  nodule, 2.2 cm, 8/18, one year follow-up ultrasound recommended, 10/19 stable, stable 3/21, 4/21, 5/23 f/u yearly until stable for 5 years, 7/24, stable, no follow-up needed, radiologist did mention the possibility that this could be a parathyroid adenoma. Covid 11/20. OA, knees, [facility]. GI- Dr Shila. vitamin B12 deficiency, 120, 12/23. Surgical History C-section x3 85, 90, 94 bilateral tubal ligation 1997/1998 bilateral breast reduction, Dr Lowery 8/22 Family History Father: deceased, HTN, CVA, diagnosed with Hypertension Mother: deceased 61 yrs, asthma, CVA 5, had gallstones, then CHF and reacted to medication, diagnosed with CHF (congestive heart failure) Paternal Grand Father: deceased Paternal Grand Mother: deceased Maternal Grand Father: deceased Maternal Grand Mother: deceased, CVAs, diagnosed with Hypertension Brother 1: alive, leukemia, blood clot, full brother, diagnosed with CVA, Hypertension Brother2: alive, high cholesterol Brother 3: alive Paternal uncle: HTN,  CVA Daughter(s): asthma Brother 4: alive 4 brother(s) - healthy. 4 brothers are half siblings with mom, multiple half siblings with dad with HTN, 1 sister with CVA, 1 brother had CAV and passed, denies any GYN family cancer hx. Social History    General:  Tobacco use cigarettes: Never smoked, Tobacco history last updated 11/22/2023, Vaping No. EXPOSURE TO PASSIVE SMOKE: no. Alcohol: no. Caffeine: no. Recreational drug use: no. Exercise: intermittent, intending to start 5/23, 6/24 occ walking, 6/25 walking occasionally. Marital Status: Divorced, Separated 6/16. Children: 3 children: De'Onna ([place] married 2021), Shawana ([place]), David([place]), 6 grands, 2 great grands. OCCUPATION: [employer]. Religion: [employer] [address] [employer]. Seat belt use: yes. Gyn History Sexual activity not currently sexually active.  Periods : postmenopausal, no period since 2011.  LMP 2011- PMB started 06/2023, 08/26/2023.  Birth control BTL.  Last pap smear date 09/10/2021 NILM/HPV-.  Last mammogram date 07/29/2023 Benign.  Denies H/O Abnormal pap smear.  Denies H/O STD.  Menarche 12/15.  OB History Number of pregnancies  3.  Pregnancy # 1  live birth, C-section delivery.  Pregnancy # 2  live birth, C-section.  Pregnancy # 3  live birth, C-section.  Allergies N.K.D.A. Hospitalization/Major Diagnostic Procedure childbirth Review of Systems    CONSTITUTIONAL:  Chills No. Fatigue No. Fever No. Night sweats No. Recent travel outside [place]  No. Sweats No. Weight change No.     OPHTHALMOLOGY:  Blurring of vision no. Change in vision no. Double vision no.     ENT:  Dizziness no. Nose bleeds no. Sore throat no. Teeth pain no.     ALLERGY:  Hives no.     CARDIOLOGY:  Chest pain no. High blood pressure no. Irregular heart beat no. Leg edema no. Palpitations no.     RESPIRATORY:  Shortness of breath  no. Cough no. Wheezing no.     UROLOGY:  Pain with urination no. Urinary urgency no. Urinary frequency no.  Urinary incontinence no. Difficulty urinating No. Blood in urine No.     GASTROENTEROLOGY:  Abdominal pain no. Appetite change no. Bloating/belching no. Blood in stool or on toilet paper no. Change in bowel movements no. Constipation no. Diarrhea no. Difficulty swallowing no. Nausea no.     FEMALE REPRODUCTIVE:  Vulvar pain no. Vulvar rash no. Abnormal vaginal bleeding , yes postmenopausal bleeding . Breast pain no. Nipple discharge no. Pain with intercourse no. Pelvic pain no. Unusual vaginal discharge no. Vaginal itching no.     MUSCULOSKELETAL:  Muscle aches no.     NEUROLOGY:  Headache no. Tingling/numbness no. Weakness no.     PSYCHOLOGY:  Depression no. Anxiety no. Nervousness no. Sleep disturbances no. Suicidal ideation no .     ENDOCRINOLOGY:  Excessive thirst no. Excessive urination no. Hair loss no. Heat or cold intolerance no.     HEMATOLOGY/LYMPH:  Abnormal bleeding no. Easy bruising no. Swollen glands no.     DERMATOLOGY:  New/changing skin lesion no. Rash no. Sores no.  Vital Signs Wt: 222.8, Wt change: -2 lbs, Ht: 66, BMI: 35.96, Pulse sitting: 73, BP sitting: 132/76. Examination    General Examination: CONSTITUTIONAL: alert, oriented, NAD.  SKIN:  moist, warm.  EYES:  Conjunctiva clear.  LUNGS: good I:E efffort noted , clear to auscultation bilaterally.  HEART: regular rate and rhythm.  ABDOMEN: soft, non-tender/non-distended, bowel sounds present.  FEMALE GENITOURINARY: normal external genitalia, labia - unremarkable, vagina - pink moist mucosa, no lesions or abnormal discharge, cervix - no discharge or lesions or CMT.. Flush with the vaginal mucosa , adnexa - no masses or tenderness, uterus - nontender and normal size on palpation.  EXTREMITIES: no edema present.  PSYCH:  affect normal, good eye contact.  Physical Examination    Chaperone present:  Chaperone present Sammye Gory 11/22/2023 09:07:50 AM >, for pelvic exam.  Assessments 1. Postmenopausal bleeding -  N95.0 (Primary)    2. Endometrial polyp - N84.0    3. Thickened endometrium - R93.89    Treatment 1. Postmenopausal bleeding        Notes: Plan hysteroscopy D/C with polypectomy using myosure. Discussed risk of infection, bleeding, and possible perforation of the uterus, with the need for further surgery with hysteroscopy. Pt is advised she will be able to return home the same day if she is doing well. Pt advised to avoid NSAIDs (Aspirin, Aleve, Advil , Ibuprofen , Motrin ) from now until surgery given risk of bleeding during surgery. She may take Tylenol  for pain management. She is advised to avoid eating or drinking starting midnight prior to surgery. Discussed post-surgery avoidance of driving for 24 hours or intercourse for 2 weeks after procedure. Pt denies discharge that itches, burns, or has an odor.   2. Endometrial polyp        Notes: Plan hysteroscopy D/C with polypectomy using myosure. Discussed risk of infection, bleeding, and possible perforation of the uterus, with the need for further surgery with hysteroscopy. Pt is advised she will be able to return home the same day if she is doing well. Pt advised to avoid NSAIDs (Aspirin, Aleve, Advil , Ibuprofen , Motrin ) from now until surgery given risk of bleeding during surgery. She may take Tylenol  for pain management. She is advised to avoid eating or drinking starting midnight prior to surgery. Discussed post-surgery avoidance of driving for 24 hours or intercourse for 2 weeks  after procedure. Pt denies discharge that itches, burns, or has an odor.   3. Thickened endometrium        Notes: Plan hysteroscopy D/C with polypectomy using myosure. Discussed risk of infection, bleeding, and possible perforation of the uterus, with the need for further surgery with hysteroscopy. Pt is advised she will be able to return home the same day if she is doing well. Pt advised to avoid NSAIDs (Aspirin, Aleve, Advil , Ibuprofen , Motrin ) from now until surgery  given risk of bleeding during surgery. She may take Tylenol  for pain management. She is advised to avoid eating or drinking starting midnight prior to surgery. Discussed post-surgery avoidance of driving for 24 hours or intercourse for 2 weeks after procedure. Pt denies discharge that itches, burns, or has an odor.   4. Others        Notes: she is given cytotec 200 mcg to place per vagina the night prior to surgery

## 2023-12-06 ENCOUNTER — Encounter (HOSPITAL_COMMUNITY)
Admission: RE | Disposition: A | Discharge status: Home / Self Care | Payer: Self-pay | Source: Home / Self Care | Attending: Obstetrics and Gynecology

## 2023-12-06 ENCOUNTER — Encounter (HOSPITAL_COMMUNITY): Payer: Self-pay | Admitting: Obstetrics and Gynecology

## 2023-12-06 ENCOUNTER — Ambulatory Visit (HOSPITAL_BASED_OUTPATIENT_CLINIC_OR_DEPARTMENT_OTHER): Payer: Self-pay | Admitting: Certified Registered Nurse Anesthetist

## 2023-12-06 ENCOUNTER — Ambulatory Visit (HOSPITAL_COMMUNITY)
Admission: RE | Admit: 2023-12-06 | Discharge: 2023-12-06 | Disposition: A | Discharge status: Home / Self Care | Attending: Obstetrics and Gynecology | Admitting: Obstetrics and Gynecology

## 2023-12-06 ENCOUNTER — Ambulatory Visit (HOSPITAL_COMMUNITY): Payer: Self-pay | Admitting: Certified Registered Nurse Anesthetist

## 2023-12-06 ENCOUNTER — Other Ambulatory Visit (HOSPITAL_COMMUNITY): Payer: Self-pay

## 2023-12-06 ENCOUNTER — Other Ambulatory Visit: Payer: Self-pay

## 2023-12-06 DIAGNOSIS — N950 Postmenopausal bleeding: Secondary | ICD-10-CM | POA: Insufficient documentation

## 2023-12-06 DIAGNOSIS — E669 Obesity, unspecified: Secondary | ICD-10-CM | POA: Diagnosis not present

## 2023-12-06 DIAGNOSIS — R9389 Abnormal findings on diagnostic imaging of other specified body structures: Secondary | ICD-10-CM | POA: Insufficient documentation

## 2023-12-06 DIAGNOSIS — N840 Polyp of corpus uteri: Secondary | ICD-10-CM | POA: Diagnosis not present

## 2023-12-06 DIAGNOSIS — Z6836 Body mass index (BMI) 36.0-36.9, adult: Secondary | ICD-10-CM | POA: Diagnosis not present

## 2023-12-06 HISTORY — PX: MYOSURE RESECTION: SHX7611

## 2023-12-06 HISTORY — PX: HYSTEROSCOPY WITH D & C: SHX1775

## 2023-12-06 LAB — CBC
HCT: 39.1 % (ref 36.0–46.0)
Hemoglobin: 11.8 g/dL — ABNORMAL LOW (ref 12.0–15.0)
MCH: 21.5 pg — ABNORMAL LOW (ref 26.0–34.0)
MCHC: 30.2 g/dL (ref 30.0–36.0)
MCV: 71.4 fL — ABNORMAL LOW (ref 80.0–100.0)
Platelets: 306 K/uL (ref 150–400)
RBC: 5.48 MIL/uL — ABNORMAL HIGH (ref 3.87–5.11)
RDW: 16.1 % — ABNORMAL HIGH (ref 11.5–15.5)
WBC: 6.4 K/uL (ref 4.0–10.5)
nRBC: 0 % (ref 0.0–0.2)

## 2023-12-06 LAB — BASIC METABOLIC PANEL WITH GFR
Anion gap: 11 (ref 5–15)
BUN: 13 mg/dL (ref 6–20)
CO2: 25 mmol/L (ref 22–32)
Calcium: 8.9 mg/dL (ref 8.9–10.3)
Chloride: 105 mmol/L (ref 98–111)
Creatinine, Ser: 1.03 mg/dL — ABNORMAL HIGH (ref 0.44–1.00)
GFR, Estimated: >60 mL/min (ref >60)
Glucose, Bld: 97 mg/dL (ref 70–99)
Potassium: 4 mmol/L (ref 3.5–5.1)
Sodium: 141 mmol/L (ref 135–145)

## 2023-12-06 SURGERY — DILATATION AND CURETTAGE /HYSTEROSCOPY
Anesthesia: General | Site: Vagina

## 2023-12-06 MED ORDER — IBUPROFEN 600 MG PO TABS
600.0000 mg | ORAL_TABLET | Freq: Four times a day (QID) | ORAL | 0 refills | Status: AC | PRN
Start: 2023-12-06 — End: ?
  Filled 2023-12-06: qty 30, 8d supply, fill #0

## 2023-12-06 MED ORDER — MIDAZOLAM HCL 2 MG/2ML IJ SOLN
INTRAMUSCULAR | Status: DC | PRN
  Administered 2023-12-06: 1 mg via INTRAVENOUS

## 2023-12-06 MED ORDER — DEXAMETHASONE SODIUM PHOSPHATE 10 MG/ML IJ SOLN
INTRAMUSCULAR | Status: AC
  Filled 2023-12-06: qty 1

## 2023-12-06 MED ORDER — CHLORHEXIDINE GLUCONATE 0.12 % MT SOLN
15.0000 mL | Freq: Once | OROMUCOSAL | Status: AC
  Administered 2023-12-06: 15 mL via OROMUCOSAL

## 2023-12-06 MED ORDER — ONDANSETRON HCL 4 MG/2ML IJ SOLN
INTRAMUSCULAR | Status: DC | PRN
  Administered 2023-12-06: 4 mg via INTRAVENOUS

## 2023-12-06 MED ORDER — FENTANYL CITRATE (PF) 250 MCG/5ML IJ SOLN
INTRAMUSCULAR | Status: AC
  Filled 2023-12-06: qty 5

## 2023-12-06 MED ORDER — ONDANSETRON HCL 4 MG/2ML IJ SOLN: 4.0000 mg | Freq: Once | INTRAMUSCULAR | Status: DC | PRN

## 2023-12-06 MED ORDER — CHLORHEXIDINE GLUCONATE 0.12 % MT SOLN
OROMUCOSAL | Status: AC
  Filled 2023-12-06: qty 15

## 2023-12-06 MED ORDER — OXYCODONE HCL 5 MG PO TABS
5.0000 mg | ORAL_TABLET | Freq: Four times a day (QID) | ORAL | 0 refills | Status: AC | PRN
  Filled 2023-12-06: qty 8, 2d supply, fill #0

## 2023-12-06 MED ORDER — ACETAMINOPHEN 500 MG PO TABS
1000.0000 mg | ORAL_TABLET | ORAL | Status: AC
  Administered 2023-12-06: 1000 mg via ORAL

## 2023-12-06 MED ORDER — ONDANSETRON HCL 4 MG/2ML IJ SOLN
INTRAMUSCULAR | Status: AC
  Filled 2023-12-06: qty 2

## 2023-12-06 MED ORDER — BUPIVACAINE HCL (PF) 0.25 % IJ SOLN
INTRAMUSCULAR | Status: DC | PRN
Start: 2023-12-06 — End: 2023-12-06
  Administered 2023-12-06: 20 mL

## 2023-12-06 MED ORDER — FENTANYL CITRATE (PF) 100 MCG/2ML IJ SOLN: 25.0000 ug | INTRAMUSCULAR | Status: DC | PRN

## 2023-12-06 MED ORDER — PROPOFOL 10 MG/ML IV BOLUS
INTRAVENOUS | Status: DC | PRN
  Administered 2023-12-06: 170 mg via INTRAVENOUS

## 2023-12-06 MED ORDER — ACETAMINOPHEN 500 MG PO TABS
1000.0000 mg | ORAL_TABLET | Freq: Three times a day (TID) | ORAL | 0 refills | Status: AC | PRN
Start: 2023-12-06 — End: ?
  Filled 2023-12-06: qty 30, 5d supply, fill #0

## 2023-12-06 MED ORDER — KETOROLAC TROMETHAMINE 30 MG/ML IJ SOLN
INTRAMUSCULAR | Status: DC | PRN
Start: 2023-12-06 — End: 2023-12-06
  Administered 2023-12-06: 30 mg via INTRAVENOUS

## 2023-12-06 MED ORDER — LIDOCAINE 2% (20 MG/ML) 5 ML SYRINGE
INTRAMUSCULAR | Status: DC | PRN
  Administered 2023-12-06: 80 mg via INTRAVENOUS

## 2023-12-06 MED ORDER — LACTATED RINGERS IV SOLN: INTRAVENOUS | Status: DC

## 2023-12-06 MED ORDER — ORAL CARE MOUTH RINSE: 15.0000 mL | Freq: Once | OROMUCOSAL | Status: AC

## 2023-12-06 MED ORDER — PROPOFOL 10 MG/ML IV BOLUS
INTRAVENOUS | Status: AC
  Filled 2023-12-06: qty 20

## 2023-12-06 MED ORDER — ACETAMINOPHEN 500 MG PO TABS
ORAL_TABLET | ORAL | Status: AC
  Filled 2023-12-06: qty 2

## 2023-12-06 MED ORDER — FENTANYL CITRATE (PF) 250 MCG/5ML IJ SOLN
INTRAMUSCULAR | Status: DC | PRN
  Administered 2023-12-06: 50 ug via INTRAVENOUS
  Administered 2023-12-06 (×2): 25 ug via INTRAVENOUS

## 2023-12-06 MED ORDER — AMISULPRIDE (ANTIEMETIC) 5 MG/2ML IV SOLN: 10.0000 mg | Freq: Once | INTRAVENOUS | Status: DC | PRN

## 2023-12-06 MED ORDER — KETOROLAC TROMETHAMINE 30 MG/ML IJ SOLN
INTRAMUSCULAR | Status: AC
  Filled 2023-12-06: qty 1

## 2023-12-06 MED ORDER — SODIUM CHLORIDE 0.9 % IR SOLN
Status: DC | PRN
  Administered 2023-12-06: 3000 mL

## 2023-12-06 MED ORDER — POVIDONE-IODINE 10 % EX SWAB: 2.0000 | Freq: Once | CUTANEOUS | Status: DC

## 2023-12-06 MED ORDER — LIDOCAINE 2% (20 MG/ML) 5 ML SYRINGE
INTRAMUSCULAR | Status: AC
  Filled 2023-12-06: qty 5

## 2023-12-06 MED ORDER — PHENYLEPHRINE 80 MCG/ML (10ML) SYRINGE FOR IV PUSH (FOR BLOOD PRESSURE SUPPORT)
PREFILLED_SYRINGE | INTRAVENOUS | Status: DC | PRN
Start: 2023-12-06 — End: 2023-12-06
  Administered 2023-12-06 (×5): 80 ug via INTRAVENOUS

## 2023-12-06 MED ORDER — DEXAMETHASONE SODIUM PHOSPHATE 10 MG/ML IJ SOLN
INTRAMUSCULAR | Status: DC | PRN
  Administered 2023-12-06: 10 mg via INTRAVENOUS

## 2023-12-06 MED ORDER — MIDAZOLAM HCL 2 MG/2ML IJ SOLN
INTRAMUSCULAR | Status: AC
Start: 2023-12-06 — End: 2023-12-06
  Filled 2023-12-06: qty 2

## 2023-12-06 SURGICAL SUPPLY — 17 items
CANISTER SUCTION 3000ML PPV (SUCTIONS) ×2 IMPLANT
CATH ROBINSON RED A/P 16FR (CATHETERS) ×2 IMPLANT
COVER MAYO STAND STRL (DRAPES) ×2 IMPLANT
DEVICE MYOSURE LITE (MISCELLANEOUS) IMPLANT
DEVICE MYOSURE REACH (MISCELLANEOUS) IMPLANT
DILATOR CANAL MILEX (MISCELLANEOUS) ×2 IMPLANT
GLOVE BIOGEL PI IND STRL 6.5 (GLOVE) ×2 IMPLANT
GLOVE SURG ENC TEXT LTX SZ6.5 (GLOVE) ×2 IMPLANT
GOWN STRL REUS W/ TWL LRG LVL3 (GOWN DISPOSABLE) ×4 IMPLANT
KIT PROCEDURE FLUENT (KITS) ×2 IMPLANT
KIT TURNOVER KIT B (KITS) ×2 IMPLANT
PACK VAGINAL MINOR WOMEN LF (CUSTOM PROCEDURE TRAY) ×2 IMPLANT
PAD OB MATERNITY 11 LF (PERSONAL CARE ITEMS) ×2 IMPLANT
SEAL ROD LENS SCOPE MYOSURE (ABLATOR) ×2 IMPLANT
SOL .9 NS 3000ML IRR UROMATIC (IV SOLUTION) IMPLANT
TOWEL GREEN STERILE FF (TOWEL DISPOSABLE) ×2 IMPLANT
UNDERPAD 30X36 HEAVY ABSORB (UNDERPADS AND DIAPERS) ×2 IMPLANT

## 2023-12-06 NOTE — H&P (Signed)
 Date of Initial H&P: 12/05/2023  History reviewed, patient examined, no change in status, stable for surgery.

## 2023-12-06 NOTE — Op Note (Signed)
 12/06/2023  10:35 AM  PATIENT:  Jenny Carroll  58 y.o. female  PRE-OPERATIVE DIAGNOSIS:  Endometrial polyp Thickened Endometrium Postmenopausal Bleeding  POST-OPERATIVE DIAGNOSIS:  Endometrial polypThickened EndometriumPostmenopausal Bleeding  PROCEDURE:  Procedure(s): DILATATION AND CURETTAGE /HYSTEROSCOPY (N/A) POLYPECTOMY WITH MYOSURE RESECTION (N/A)  SURGEON:  Surgeons and Role:    DEWAINE Rosalva Sawyer, MD - Primary  PHYSICIAN ASSISTANT:   ASSISTANTS: none   ANESTHESIA:   general  EBL:  5 mL   BLOOD ADMINISTERED:none  DRAINS: none   LOCAL MEDICATIONS USED:  MARCAINE      SPECIMEN:  Source of Specimen:  Endometrial mass and curettings   DISPOSITION OF SPECIMEN:  PATHOLOGY  COUNTS:  YES  TOURNIQUET:  * No tourniquets in log *  DICTATION: .Note written in EPIC  PLAN OF CARE: Discharge to home after PACU  PATIENT DISPOSITION:  PACU - hemodynamically stable.   Delay start of Pharmacological VTE agent (>24hrs) due to surgical blood loss or risk of bleeding: not applicable  Findings: Normal appearing external genitalia and vaginal mucosa. The cervix is flush with the vaginal mucosa and difficult to distinguish from the vaginal mucosa. Endometrial polyp noted at the fundal aspect of the endometrium. The surrounding endometrium appeared atrophic.   Procedure: Patient was taken to the operating room #7 at [facility] where she was placed under general anesthesia. She was placed in the dorsal lithotomy position. She was prepped and draped in the usual sterile fashion. A speculum was placed into the vaginal vault.It was difficult to distinquish the cervix from the vaginal mucosa visually. The cervix was palpated on bimanual exam.  The anterior lip of the cervix was grasped with a single-tooth tenaculum. The cervical os was not readily identified due to the distortion of her anatomy. The hysteroscopy was used to identify the cervical os and inserted with out dilation. The  cervix was hydro-dilated with hysteroscopy. The endometrium was evaluated with the findings noted above.  Myosure reach  blade was introduced throught the hysteroscope. The endometrial mass was removed  and currettings were obtained with the reach blade.  There was no evidence of perforation. Hysteroscope was then removed.  The single-tooth tenaculum was removed from the anterior lip of the cervix. Excellent hemostasis was noted. 10 cc of  Quarter percent marcaine  were injected at the 4 and 8 oclock of the cervix.  The speculum was removed from the patient's vagina. She was awakened from anesthesia taken to the recovery  room awake and in stable condition. Sponge lap and needle counts were correct x 2.  Saline deficit was 465 cc.

## 2023-12-06 NOTE — Transfer of Care (Signed)
 Immediate Anesthesia Transfer of Care Note  Patient: Jenny Carroll  Procedure(s) Performed: DILATATION AND CURETTAGE /HYSTEROSCOPY (Vagina ) POLYPECTOMY WITH MYOSURE RESECTION (Vagina )  Patient Location: PACU  Anesthesia Type:General  Level of Consciousness: awake, alert , and oriented  Airway & Oxygen Therapy: Patient Spontanous Breathing and Patient connected to nasal cannula oxygen  Post-op Assessment: Report given to RN, Post -op Vital signs reviewed and stable, Patient moving all extremities X 4, and Patient able to stick tongue midline  Post vital signs: Reviewed and stable  Last Vitals:  Vitals Value Taken Time  BP 128/72 12/06/23 10:39  Temp 36.6 C 12/06/23 10:39  Pulse 76 12/06/23 10:39  Resp 10   SpO2 98 % 12/06/23 10:39    Last Pain:  Vitals:   12/06/23 0736  TempSrc: Oral  PainSc: 0-No pain      Patients Stated Pain Goal: 6 (12/06/23 0736)  Complications: No notable events documented.

## 2023-12-06 NOTE — Anesthesia Procedure Notes (Signed)
 Procedure Name: LMA Insertion Date/Time: 12/06/2023 9:45 AM  Performed by: Harrold Macintosh, CRNAPre-anesthesia Checklist: Patient identified, Emergency Drugs available, Suction available, Patient being monitored and Timeout performed Patient Re-evaluated:Patient Re-evaluated prior to induction Oxygen Delivery Method: Circle system utilized Preoxygenation: Pre-oxygenation with 100% oxygen Induction Type: IV induction LMA: LMA inserted LMA Size: 4.0 Number of attempts: 1 Placement Confirmation: positive ETCO2 and breath sounds checked- equal and bilateral Tube secured with: Tape Dental Injury: Teeth and Oropharynx as per pre-operative assessment

## 2023-12-06 NOTE — Discharge Instructions (Signed)
  Post Anesthesia Home Care Instructions  Activity: Get plenty of rest for the remainder of the day. A responsible individual must stay with you for 24 hours following the procedure.  For the next 24 hours, DO NOT: -Drive a car -Advertising copywriter -Drink alcoholic beverages -Take any medication unless instructed by your physician -Make any legal decisions or sign important papers.  Meals: Start with liquid foods such as gelatin or soup. Progress to regular foods as tolerated. Avoid greasy, spicy, heavy foods. If nausea and/or vomiting occur, drink only clear liquids until the nausea and/or vomiting subsides. Call your physician if vomiting continues.  Special Instructions/Symptoms: Your throat may feel dry or sore from the anesthesia or the breathing tube placed in your throat during surgery. If this causes discomfort, gargle with warm salt water. The discomfort should disappear within 24 hours.      D & C Home care Instructions:   Personal hygiene:  Used sanitary napkins for vaginal drainage not tampons. Shower or tub bathe the day after your procedure. No douching until bleeding stops. Always wipe from front to back after  Elimination.  Activity: Do not drive or operate any equipment today. The effects of the anesthesia are still present and drowsiness may result. Rest today, not necessarily flat bed rest, just take it easy. You may resume your normal activity in one to 2 days.  Sexual activity: No intercourse for one week or as indicated by your physician  Diet: Eat a light diet as desired this evening. You may resume a regular diet tomorrow.  Return to work: One to 2 days.  General Expectations of your surgery: Vaginal bleeding should be no heavier than a normal period. Spotting may continue up to 10 days. Mild cramps may continue for a couple of days. You may have a regular period in 2-6 weeks.  Unexpected observations call your doctor if these occur: persistent or heavy  bleeding. Severe abdominal cramping or pain. Elevation of temperature greater than 100F.

## 2023-12-06 NOTE — Anesthesia Preprocedure Evaluation (Signed)
 Anesthesia Evaluation  Patient identified by MRN, date of birth, ID band Patient awake    Reviewed: Allergy & Precautions, NPO status , Patient's Chart, lab work & pertinent test results  Airway Mallampati: III  TM Distance: >3 FB Neck ROM: Full    Dental  (+) Teeth Intact, Dental Advisory Given, Caps,    Pulmonary neg pulmonary ROS   Pulmonary exam normal breath sounds clear to auscultation       Cardiovascular negative cardio ROS Normal cardiovascular exam Rhythm:Regular Rate:Normal     Neuro/Psych negative neurological ROS     GI/Hepatic negative GI ROS, Neg liver ROS,,,  Endo/Other  Obesity   Renal/GU negative Renal ROS     Musculoskeletal negative musculoskeletal ROS (+)    Abdominal   Peds  Hematology negative hematology ROS (+)   Anesthesia Other Findings Day of surgery medications reviewed with the patient.  Reproductive/Obstetrics Endometrial polyp  Thickened endometrium Postmenopausal bleeding                                Anesthesia Physical Anesthesia Plan  ASA: 2  Anesthesia Plan: General   Post-op Pain Management: Tylenol  PO (pre-op)* and Toradol  IV (intra-op)*   Induction: Intravenous  PONV Risk Score and Plan: 4 or greater and Midazolam , Dexamethasone  and Ondansetron   Airway Management Planned: LMA  Additional Equipment:   Intra-op Plan:   Post-operative Plan: Extubation in OR  Informed Consent: I have reviewed the patients History and Physical, chart, labs and discussed the procedure including the risks, benefits and alternatives for the proposed anesthesia with the patient or authorized representative who has indicated his/her understanding and acceptance.     Dental advisory given  Plan Discussed with: CRNA  Anesthesia Plan Comments:         Anesthesia Quick Evaluation

## 2023-12-06 NOTE — Anesthesia Postprocedure Evaluation (Signed)
 Anesthesia Post Note  Patient: Jenny Carroll  Procedure(s) Performed: DILATATION AND CURETTAGE /HYSTEROSCOPY (Vagina ) POLYPECTOMY WITH MYOSURE RESECTION (Vagina )     Patient location during evaluation: PACU Anesthesia Type: General Level of consciousness: awake and alert Pain management: pain level controlled Vital Signs Assessment: post-procedure vital signs reviewed and stable Respiratory status: spontaneous breathing, nonlabored ventilation and respiratory function stable Cardiovascular status: blood pressure returned to baseline and stable Postop Assessment: no apparent nausea or vomiting Anesthetic complications: no   No notable events documented.  Last Vitals:  Vitals:   12/06/23 1115 12/06/23 1130  BP: 119/68 114/85  Pulse: 67 67  Resp: 11 17  Temp:    SpO2: 100% 100%    Last Pain:  Vitals:   12/06/23 1130  TempSrc:   PainSc: 0-No pain                 Garnette FORBES Skillern

## 2023-12-07 ENCOUNTER — Encounter (HOSPITAL_COMMUNITY): Payer: Self-pay | Admitting: Obstetrics and Gynecology

## 2023-12-07 LAB — SURGICAL PATHOLOGY

## 2023-12-20 DIAGNOSIS — Z9889 Other specified postprocedural states: Secondary | ICD-10-CM | POA: Diagnosis not present

## 2023-12-20 DIAGNOSIS — Z4889 Encounter for other specified surgical aftercare: Secondary | ICD-10-CM | POA: Diagnosis not present

## 2024-01-01 DIAGNOSIS — E538 Deficiency of other specified B group vitamins: Secondary | ICD-10-CM | POA: Diagnosis not present
